# Patient Record
Sex: Female | Born: 1970 | Race: Black or African American | Hispanic: No | Marital: Single | Smoking: Never smoker
Health system: Southern US, Community
[De-identification: ages and names within clinical notes are randomized; demographics above are authoritative.]

## PROBLEM LIST (undated history)

## (undated) DIAGNOSIS — E785 Hyperlipidemia, unspecified: Secondary | ICD-10-CM

## (undated) DIAGNOSIS — I10 Essential (primary) hypertension: Secondary | ICD-10-CM

## (undated) HISTORY — PX: TOTAL ABDOMINAL HYSTERECTOMY: SHX209

---

## 2004-06-24 ENCOUNTER — Emergency Department: Payer: Self-pay | Admitting: General Practice

## 2004-07-14 ENCOUNTER — Inpatient Hospital Stay: Payer: Self-pay | Admitting: Obstetrics & Gynecology

## 2005-11-06 ENCOUNTER — Emergency Department: Payer: Self-pay | Admitting: Emergency Medicine

## 2005-11-29 ENCOUNTER — Emergency Department: Payer: Self-pay | Admitting: Emergency Medicine

## 2006-06-13 ENCOUNTER — Emergency Department: Payer: Self-pay | Admitting: Emergency Medicine

## 2006-08-21 ENCOUNTER — Emergency Department: Payer: Self-pay | Admitting: Emergency Medicine

## 2006-08-21 ENCOUNTER — Other Ambulatory Visit: Payer: Self-pay

## 2006-11-09 ENCOUNTER — Ambulatory Visit: Payer: Self-pay | Admitting: Orthopedic Surgery

## 2006-11-16 ENCOUNTER — Ambulatory Visit: Payer: Self-pay | Admitting: Orthopedic Surgery

## 2006-11-30 ENCOUNTER — Ambulatory Visit: Payer: Self-pay | Admitting: Orthopedic Surgery

## 2007-07-23 ENCOUNTER — Emergency Department: Payer: Self-pay | Admitting: Emergency Medicine

## 2009-01-04 ENCOUNTER — Ambulatory Visit: Payer: Self-pay | Admitting: Family Medicine

## 2009-01-31 ENCOUNTER — Ambulatory Visit: Payer: Self-pay | Admitting: Family Medicine

## 2009-11-17 ENCOUNTER — Emergency Department: Payer: Self-pay | Admitting: Internal Medicine

## 2010-07-12 ENCOUNTER — Emergency Department: Payer: Self-pay | Admitting: Unknown Physician Specialty

## 2010-08-13 ENCOUNTER — Emergency Department: Payer: Self-pay | Admitting: Unknown Physician Specialty

## 2010-12-21 ENCOUNTER — Emergency Department: Payer: Self-pay | Admitting: Emergency Medicine

## 2011-03-21 ENCOUNTER — Emergency Department: Payer: Self-pay | Admitting: Emergency Medicine

## 2011-03-31 ENCOUNTER — Ambulatory Visit: Payer: Self-pay | Admitting: Emergency Medicine

## 2011-12-01 ENCOUNTER — Emergency Department: Payer: Self-pay | Admitting: Emergency Medicine

## 2012-03-01 ENCOUNTER — Emergency Department: Payer: Self-pay | Admitting: Emergency Medicine

## 2012-03-17 ENCOUNTER — Ambulatory Visit: Payer: Self-pay | Admitting: Family Medicine

## 2012-07-24 ENCOUNTER — Emergency Department: Payer: Self-pay | Admitting: Emergency Medicine

## 2012-11-22 ENCOUNTER — Emergency Department: Payer: Self-pay | Admitting: Emergency Medicine

## 2012-11-22 LAB — CBC
HCT: 36.3 % (ref 35.0–47.0)
HGB: 12.5 g/dL (ref 12.0–16.0)
MCH: 28.7 pg (ref 26.0–34.0)
RBC: 4.35 10*6/uL (ref 3.80–5.20)
RDW: 13.6 % (ref 11.5–14.5)
WBC: 5.7 10*3/uL (ref 3.6–11.0)

## 2012-11-22 LAB — BASIC METABOLIC PANEL
Anion Gap: 5 — ABNORMAL LOW (ref 7–16)
Calcium, Total: 8.5 mg/dL (ref 8.5–10.1)
Co2: 28 mmol/L (ref 21–32)
Creatinine: 0.7 mg/dL (ref 0.60–1.30)
EGFR (Non-African Amer.): >60

## 2012-11-22 LAB — TROPONIN I: Troponin-I: <0.02 ng/mL

## 2012-12-29 ENCOUNTER — Emergency Department: Payer: Self-pay | Admitting: Emergency Medicine

## 2013-03-10 ENCOUNTER — Emergency Department: Payer: Self-pay | Admitting: Emergency Medicine

## 2013-03-21 ENCOUNTER — Emergency Department: Payer: Self-pay | Admitting: Emergency Medicine

## 2013-03-21 LAB — BASIC METABOLIC PANEL WITH GFR
Anion Gap: 3 — ABNORMAL LOW
BUN: 9 mg/dL
Calcium, Total: 9.2 mg/dL
Chloride: 105 mmol/L
Co2: 30 mmol/L
Creatinine: 0.8 mg/dL
EGFR (African American): >60
EGFR (Non-African Amer.): >60
Glucose: 112 mg/dL — ABNORMAL HIGH
Osmolality: 275
Potassium: 3.6 mmol/L
Sodium: 138 mmol/L

## 2013-03-21 LAB — CBC
HCT: 38.8 %
HGB: 13.8 g/dL
MCH: 29.2 pg
MCHC: 35.6 g/dL
MCV: 82 fL
Platelet: 193 x10 3/mm 3
RBC: 4.74 X10 6/mm 3
RDW: 13 %
WBC: 3.9 x10 3/mm 3

## 2013-03-21 LAB — PROTIME-INR
INR: 1.1
Prothrombin Time: 14.2 s

## 2013-04-27 ENCOUNTER — Emergency Department: Payer: Self-pay | Admitting: Emergency Medicine

## 2013-04-27 LAB — COMPREHENSIVE METABOLIC PANEL
Albumin: 4 g/dL (ref 3.4–5.0)
Alkaline Phosphatase: 81 U/L (ref 50–136)
Bilirubin,Total: 0.7 mg/dL (ref 0.2–1.0)
Co2: 29 mmol/L (ref 21–32)
Potassium: 3.5 mmol/L (ref 3.5–5.1)
SGOT(AST): 23 U/L (ref 15–37)
SGPT (ALT): 21 U/L (ref 12–78)
Sodium: 136 mmol/L (ref 136–145)
Total Protein: 8 g/dL (ref 6.4–8.2)

## 2013-04-27 LAB — CBC
HCT: 39.9 % (ref 35.0–47.0)
MCHC: 34.7 g/dL (ref 32.0–36.0)
Platelet: 202 10*3/uL (ref 150–440)
RDW: 12.8 % (ref 11.5–14.5)
WBC: 4.9 10*3/uL (ref 3.6–11.0)

## 2013-04-27 LAB — PRO B NATRIURETIC PEPTIDE: B-Type Natriuretic Peptide: 29 pg/mL (ref 0–125)

## 2013-04-27 LAB — TROPONIN I: Troponin-I: <0.02 ng/mL

## 2013-04-27 LAB — CK TOTAL AND CKMB (NOT AT ARMC): CK-MB: 0.9 ng/mL (ref 0.5–3.6)

## 2013-06-14 ENCOUNTER — Emergency Department: Payer: Self-pay | Admitting: Emergency Medicine

## 2013-07-17 ENCOUNTER — Emergency Department: Payer: Self-pay | Admitting: Emergency Medicine

## 2014-04-03 ENCOUNTER — Emergency Department: Payer: Self-pay | Admitting: Emergency Medicine

## 2014-04-03 LAB — BASIC METABOLIC PANEL
Anion Gap: 6 — ABNORMAL LOW (ref 7–16)
BUN: 11 mg/dL (ref 7–18)
Calcium, Total: 8.8 mg/dL (ref 8.5–10.1)
Chloride: 105 mmol/L (ref 98–107)
Co2: 27 mmol/L (ref 21–32)
Creatinine: 0.98 mg/dL (ref 0.60–1.30)
EGFR (Non-African Amer.): >60
Glucose: 127 mg/dL — ABNORMAL HIGH (ref 65–99)
Osmolality: 277 (ref 275–301)
Potassium: 3.5 mmol/L (ref 3.5–5.1)
Sodium: 138 mmol/L (ref 136–145)

## 2014-04-03 LAB — TROPONIN I
Troponin-I: <0.02 ng/mL
Troponin-I: <0.02 ng/mL

## 2014-04-03 LAB — CBC
HCT: 38.4 % (ref 35.0–47.0)
HGB: 13 g/dL (ref 12.0–16.0)
MCH: 28.4 pg (ref 26.0–34.0)
MCHC: 33.9 g/dL (ref 32.0–36.0)
MCV: 84 fL (ref 80–100)
PLATELETS: 194 10*3/uL (ref 150–440)
RBC: 4.6 10*6/uL (ref 3.80–5.20)
RDW: 13.4 % (ref 11.5–14.5)
WBC: 5.4 10*3/uL (ref 3.6–11.0)

## 2014-04-03 LAB — PRO B NATRIURETIC PEPTIDE: B-TYPE NATIURETIC PEPTID: 49 pg/mL (ref 0–125)

## 2014-09-17 ENCOUNTER — Ambulatory Visit: Payer: Self-pay | Admitting: Internal Medicine

## 2014-10-07 LAB — TSH: TSH: 1.99 u[IU]/mL (ref <=5.90)

## 2014-10-17 LAB — LIPID PANEL
Cholesterol: 221 mg/dL — AB (ref 0–200)
HDL: 33 mg/dL — AB (ref 35–70)
LDL Cholesterol: 149 mg/dL
Triglycerides: 193 mg/dL — AB (ref 40–160)

## 2014-10-17 LAB — BASIC METABOLIC PANEL
BUN: 11 mg/dL (ref 4–21)
Creatinine: 0.9 mg/dL (ref <=1.1)

## 2014-10-17 LAB — CBC AND DIFFERENTIAL: HEMOGLOBIN: 13.9 g/dL (ref 12.0–16.0)

## 2014-10-17 LAB — HEMOGLOBIN A1C: Hgb A1c MFr Bld: 5.9 % (ref 4.0–6.0)

## 2014-11-07 ENCOUNTER — Ambulatory Visit
Admit: 2014-11-07 | Disposition: A | Discharge status: Home / Self Care | Payer: Self-pay | Attending: Family Medicine | Admitting: Family Medicine

## 2015-06-05 ENCOUNTER — Encounter: Payer: Self-pay | Admitting: Internal Medicine

## 2015-06-05 DIAGNOSIS — R9389 Abnormal findings on diagnostic imaging of other specified body structures: Secondary | ICD-10-CM | POA: Insufficient documentation

## 2015-06-05 DIAGNOSIS — R739 Hyperglycemia, unspecified: Secondary | ICD-10-CM | POA: Insufficient documentation

## 2015-06-05 DIAGNOSIS — R0789 Other chest pain: Secondary | ICD-10-CM | POA: Insufficient documentation

## 2015-06-05 DIAGNOSIS — R600 Localized edema: Secondary | ICD-10-CM | POA: Insufficient documentation

## 2015-06-05 DIAGNOSIS — R252 Cramp and spasm: Secondary | ICD-10-CM | POA: Insufficient documentation

## 2015-06-05 DIAGNOSIS — G44219 Episodic tension-type headache, not intractable: Secondary | ICD-10-CM | POA: Insufficient documentation

## 2016-01-09 ENCOUNTER — Emergency Department
Admission: EM | Admit: 2016-01-09 | Discharge: 2016-01-09 | Disposition: A | Discharge status: Home / Self Care | Payer: Self-pay | Attending: Emergency Medicine | Admitting: Emergency Medicine

## 2016-01-09 DIAGNOSIS — S29012A Strain of muscle and tendon of back wall of thorax, initial encounter: Secondary | ICD-10-CM | POA: Insufficient documentation

## 2016-01-09 DIAGNOSIS — Y999 Unspecified external cause status: Secondary | ICD-10-CM | POA: Insufficient documentation

## 2016-01-09 DIAGNOSIS — Y929 Unspecified place or not applicable: Secondary | ICD-10-CM | POA: Insufficient documentation

## 2016-01-09 DIAGNOSIS — Y9389 Activity, other specified: Secondary | ICD-10-CM | POA: Insufficient documentation

## 2016-01-09 DIAGNOSIS — X503XXA Overexertion from repetitive movements, initial encounter: Secondary | ICD-10-CM | POA: Insufficient documentation

## 2016-01-09 MED ORDER — MELOXICAM 15 MG PO TABS: 15.0000 mg | ORAL_TABLET | Freq: Every day | ORAL | Status: DC

## 2016-01-09 MED ORDER — KETOROLAC TROMETHAMINE 60 MG/2ML IM SOLN
60.0000 mg | Freq: Once | INTRAMUSCULAR | Status: AC
  Administered 2016-01-09: 60 mg via INTRAMUSCULAR
  Filled 2016-01-09: qty 2

## 2016-01-09 MED ORDER — METHOCARBAMOL 500 MG PO TABS: 500.0000 mg | ORAL_TABLET | Freq: Four times a day (QID) | ORAL | Status: DC

## 2016-01-09 MED ORDER — DIAZEPAM 5 MG/ML IJ SOLN
5.0000 mg | Freq: Once | INTRAMUSCULAR | Status: AC
  Administered 2016-01-09: 5 mg via INTRAMUSCULAR
  Filled 2016-01-09: qty 2

## 2016-01-09 NOTE — ED Notes (Signed)
In with co back pain states from upper back to lower back , does a lot of moving and lifting at work. States worse when she moves or is lifting.

## 2016-01-09 NOTE — ED Notes (Signed)
Upon assessment Pt. Reports upper back pain 9/10. 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 Upon assessment, pt. Laying across the bed playing with granddaughter in the room and eating m[employer]ds. Pt. Reports home tx of iburpofen 3 days ago that wore off and denies tx since. Pt. Denies positional changes to relieve pain.

## 2016-01-09 NOTE — ED Provider Notes (Signed)
[facility] Emergency Department Provider Note  ____________________________________________  Time seen: Approximately 9:51 PM  I have reviewed the triage vital signs and the nursing notes.   HISTORY  Chief Complaint Back Pain    HPI Ashley Stephenson is a 45 y.o. female presents to the ER complaining of right sided mid back pain. 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 The patient denies any injury to area. She states that its both a burning and tight sensation. No numbness and tingling in extremities. No HA, neck pain, fever/chills, abd pain, N/V. Patient has not tried any medications prior to arrival.   No past medical history on file.  Patient Active Problem List   Diagnosis Date Noted  . Abnormal chest x-ray 06/05/2015  . Atypical chest pain 06/05/2015  . Edema extremities 06/05/2015  . Episodic tension type headache 06/05/2015  . Blood glucose elevated 06/05/2015  . Muscle cramp, nocturnal 06/05/2015    Past Surgical History  Procedure Laterality Date  . Total abdominal hysterectomy      bleeding    Current Outpatient Rx  Name  Route  Sig  Dispense  Refill  . meloxicam (MOBIC) 15 MG tablet   Oral   Take 1 tablet (15 mg total) by mouth daily.   30 tablet   0   . methocarbamol (ROBAXIN) 500 MG tablet   Oral   Take 1 tablet (500 mg total) by mouth 4 (four) times daily.   16 tablet   0     Allergies Penicillins  Family History  Problem Relation Age of Onset  . CAD Mother     Social History Social History  Substance Use Topics  . Smoking status: Never Smoker   . Smokeless tobacco: Not on file  . Alcohol Use: No     Review of Systems  Constitutional: No fever/chills Cardiovascular: no chest pain. Respiratory: no cough. No SOB. Gastrointestinal: No abdominal pain.  No nausea, no vomiting.   Musculoskeletal: Positive for mid back pain on R Skin: Negative for rash, abrasions,  lacerations, ecchymosis. Neurological: Negative for headaches, focal weakness or numbness. 10-point ROS otherwise negative.  ____________________________________________   PHYSICAL EXAM:  VITAL SIGNS: ED Triage Vitals  Enc Vitals Group     BP 01/09/16 2020 144/70 mmHg     Pulse Rate 01/09/16 2020 84     Resp 01/09/16 2020 18     Temp 01/09/16 2020 98.2 F (36.8 C)     Temp Source 01/09/16 2020 Oral     SpO2 01/09/16 2020 98 %     Weight 01/09/16 2020 270 lb (122.471 kg)     Height 01/09/16 2020  (1.575 m)     Head Cir --      Peak Flow --      Pain Score 01/09/16 2020 9     Pain Loc --      Pain Edu? --      Excl. in GC? --      Constitutional: Alert and oriented. Well appearing and in no acute distress. Eyes: Conjunctivae are normal. PERRL. EOMI. Head: Atraumatic. Neck: No stridor.  No cervical spine tenderness to palpation.  Cardiovascular: Normal rate, regular rhythm. Normal S1 and S2.  Good peripheral circulation. Respiratory: Normal respiratory effort without tachypnea or retractions. Lungs CTAB. Good air entry to the bases with no decreased or absent breath sounds.  Musculoskeletal: Full range of motion to all extremities. No gross deformities appreciated. No deformity noted  to spine upon inspection. FROM of back. Non-tender to palpation midline spinal processes. Tenderness diffusely to paraspinal muscle group in right side thoracic region. Spasms are palpated. Pulses and sensation intact x 4 extremities.  Neurologic:  Normal speech and language. No gross focal neurologic deficits are appreciated.  Skin:  Skin is warm, dry and intact. No rash noted. Psychiatric: Mood and affect are normal. Speech and behavior are normal. Patient exhibits appropriate insight and judgement.   ____________________________________________   LABS (all labs ordered are listed, but only abnormal results are displayed)  Labs Reviewed - No data to  display ____________________________________________  EKG   ____________________________________________  RADIOLOGY   No results found.  ____________________________________________    PROCEDURES  Procedure(s) performed:       Medications  ketorolac (TORADOL) injection 60 mg (60 mg Intramuscular Given 01/09/16 2206)  diazepam (VALIUM) injection 5 mg (5 mg Intramuscular Given 01/09/16 2206)     ____________________________________________   INITIAL IMPRESSION / ASSESSMENT AND PLAN / ED COURSE  Pertinent labs & imaging results that were available during my care of the patient were reviewed by me and considered in my medical decision making (see chart for details).  Patient's diagnosis is consistent with muscle strain to thoracic region. Patient is given toradol and valium IM injections in the ED. Patient will be discharged home with prescriptions for meloxicam and robaxin. Patient is to follow up with primary care provider as needed or otherwise directed. Patient is given ED precautions to return to the ED for any worsening or new symptoms.     ____________________________________________  FINAL CLINICAL IMPRESSION(S) / ED DIAGNOSES  Final diagnoses:  Strain of thoracic paraspinal muscles excluding T1 and T2 levels, initial encounter      NEW MEDICATIONS STARTED DURING THIS VISIT:  Discharge Medication List as of 01/09/2016  9:53 PM    START taking these medications   Details  meloxicam (MOBIC) 15 MG tablet Take 1 tablet (15 mg total) by mouth daily., Starting 01/09/2016, Until Discontinued, Print    methocarbamol (ROBAXIN) 500 MG tablet Take 1 tablet (500 mg total) by mouth 4 (four) times daily., Starting 01/09/2016, Until Discontinued, Print            This chart was dictated using voice recognition software/Dragon. Despite best efforts to proofread, errors can occur which can change the meaning. Any change was purely unintentional.    Racheal PatchesJonathan D  Keelee Yankey, PA-C 01/10/16 0245  Loleta Roseory Forbach, MD 01/13/16 1528

## 2016-01-09 NOTE — Discharge Instructions (Signed)

## 2016-07-04 ENCOUNTER — Emergency Department: Payer: Self-pay

## 2016-07-04 ENCOUNTER — Encounter: Payer: Self-pay | Admitting: Emergency Medicine

## 2016-07-04 ENCOUNTER — Emergency Department
Admission: EM | Admit: 2016-07-04 | Discharge: 2016-07-04 | Disposition: A | Discharge status: Home / Self Care | Payer: Self-pay | Attending: Student in an Organized Health Care Education/Training Program | Admitting: Student in an Organized Health Care Education/Training Program

## 2016-07-04 DIAGNOSIS — J40 Bronchitis, not specified as acute or chronic: Secondary | ICD-10-CM | POA: Insufficient documentation

## 2016-07-04 DIAGNOSIS — Z79899 Other long term (current) drug therapy: Secondary | ICD-10-CM | POA: Insufficient documentation

## 2016-07-04 MED ORDER — PREDNISONE 10 MG PO TABS: 40.0000 mg | ORAL_TABLET | Freq: Every day | ORAL | 0 refills | Status: AC

## 2016-07-04 NOTE — ED Triage Notes (Signed)
Patient reports coughing up phlegm for approx one month. Denies other URI symptoms. Denies any pain. Patient states phlegm is clear in nature. Patient in no acute distress. Ambulatory to triage without difficulty, speaking in complete sentences without difficulty.

## 2016-07-04 NOTE — ED Notes (Signed)

## 2016-07-04 NOTE — ED Provider Notes (Signed)
[facility] Emergency Department Provider Note  ____________________________________________  Time seen: Approximately 3:38 PM  I have reviewed the triage vital signs and the nursing notes.   HISTORY  Chief Complaint Cough  HPI Ashley Stephenson is a 45 y.o. female who presents with 1 month of productive cough. Patient has been coughing up clear sputum. Patient was sick with a cold 2 months ago. Patient denies nasal congestion. Patient denies fever. Patient has not taken anything for symptoms. Patient does not smoke.   History reviewed. No pertinent past medical history.  Patient Active Problem List   Diagnosis Date Noted  . Abnormal chest x-ray 06/05/2015  . Atypical chest pain 06/05/2015  . Edema extremities 06/05/2015  . Episodic tension type headache 06/05/2015  . Blood glucose elevated 06/05/2015  . Muscle cramp, nocturnal 06/05/2015    Past Surgical History:  Procedure Laterality Date  . TOTAL ABDOMINAL HYSTERECTOMY     bleeding    Prior to Admission medications   Medication Sig Start Date End Date Taking? Authorizing Provider  meloxicam (MOBIC) 15 MG tablet Take 1 tablet (15 mg total) by mouth daily. 01/09/16   Delorise RoyalsJonathan D Cuthriell, PA-C  methocarbamol (ROBAXIN) 500 MG tablet Take 1 tablet (500 mg total) by mouth 4 (four) times daily. 01/09/16   Delorise RoyalsJonathan D Cuthriell, PA-C  predniSONE (DELTASONE) 10 MG tablet Take 4 tablets (40 mg total) by mouth daily. 07/04/16 07/09/16  Enid DerryAshley Verline Kong, PA-C    Allergies Penicillins  Family History  Problem Relation Age of Onset  . CAD Mother     Social History Social History  Substance Use Topics  . Smoking status: Never Smoker  . Smokeless tobacco: Never Used  . Alcohol use No    Review of Systems Constitutional: Negative fever/chills.  ENT: Negative sore throat. No nasal congestion.  Cardiovascular: Denies chest pain. Respiratory: Negative shortness of breath. Positive for  cough. Gastrointestinal: Negative nausea. Negative vomiting.  Negative diarrhea.  Musculoskeletal: Negative for body aches. Skin: Negative for rash.  ____________________________________________   PHYSICAL EXAM:  VITAL SIGNS: ED Triage Vitals  Enc Vitals Group     BP 07/04/16 1444 (!) 153/64     Pulse Rate 07/04/16 1444 90     Resp 07/04/16 1444 18     Temp 07/04/16 1444 97.9 F (36.6 C)     Temp Source 07/04/16 1444 Oral     SpO2 07/04/16 1444 98 %     Weight 07/04/16 1445 276 lb 11.2 oz (125.5 kg)     Height 07/04/16 1445 5\' 5"  (1.651 m)     Head Circumference --      Peak Flow --      Pain Score --      Pain Loc --      Pain Edu? --      Excl. in GC? --     Constitutional: Alert and oriented. Well appearing and in no acute distress. Eyes: Conjunctivae are normal. EOMI. Nose: No congestion; no rhinnorhea. Mouth/Throat: Mucous membranes are moist.  Oropharynx non erythematous. Tonsils appear non enlarged. Neck: No stridor.  Cardiovascular: Normal rate, regular rhythm. Grossly normal heart sounds.  Good peripheral circulation. Respiratory: Normal respiratory effort.  No retractions. Lungs CTAB. Gastrointestinal: Soft and nontender.  Musculoskeletal: FROM x 4 extremities.  Neurologic:  Normal speech and language.  Skin:  Skin is warm, dry and intact. No rash noted. Psychiatric: Mood and affect are normal. Speech and behavior are normal.  ____________________________________________   LABS (all labs ordered are  listed, but only abnormal results are displayed)  Labs Reviewed - No data to display ____________________________________________  EKG   RADIOLOGY I, Enid DerryAshley Tommy Goostree, personally viewed and evaluated these images (plain radiographs) as part of my medical decision making, as well as reviewing the written report by the radiologist.   Xray findings per radiology FINDINGS:  Lung volumes are normal. No consolidative airspace disease. No  pleural effusions. No  pneumothorax. No pulmonary nodule or mass  noted. Pulmonary vasculature and the cardiomediastinal silhouette  are within normal limits.    IMPRESSION:  No radiographic evidence of acute cardiopulmonary disease.      PROCEDURES  Critical Care performed: No  ____________________________________________   INITIAL IMPRESSION / ASSESSMENT AND PLAN / ED COURSE  Clinical Course     Pertinent labs & imaging results that were available during my care of the patient were reviewed by me and considered in my medical decision making (see chart for details).  Patient likely has viral bronchitis that has persisted after an upper respiratory infection 2 months ago. She was given a course of prednisone. Patient was instructed to follow-up with family doctor and return to the ED if symptoms persist or worsen. No evidence of PE, pneumonia, CHF, pneumothorax, pulmonary edema, Mi, or airway obstruction.   ____________________________________________   FINAL CLINICAL IMPRESSION(S) / ED DIAGNOSES  Final diagnoses:  Bronchitis    Note:  This document was prepared using Dragon voice recognition software and may include unintentional dictation errors.    Enid DerryAshley Janson Lamar, PA-C 07/04/16 1820    Willy EddyPatrick Robinson, MD 07/05/16 [phone]

## 2016-09-11 ENCOUNTER — Encounter: Payer: Self-pay | Admitting: Emergency Medicine

## 2016-09-11 ENCOUNTER — Emergency Department
Admission: EM | Admit: 2016-09-11 | Discharge: 2016-09-11 | Disposition: A | Discharge status: Home / Self Care | Payer: Self-pay | Attending: Emergency Medicine | Admitting: Emergency Medicine

## 2016-09-11 DIAGNOSIS — J069 Acute upper respiratory infection, unspecified: Secondary | ICD-10-CM | POA: Insufficient documentation

## 2016-09-11 NOTE — ED Provider Notes (Signed)
[facility] Emergency Department Provider Note  ____________________________________________   First MD Initiated Contact with Patient 09/11/16 [phone]     (approximate)  I have reviewed the triage vital signs and the nursing notes.   HISTORY  Chief Complaint Torticollis and sore throat   HPI Ashley Stephenson is a 46 y.o. female is here complaining of sore throat, left earache, cough and congestion for 4 days.Patient is unaware of any fever or chills. She denies any nausea or vomiting. Patient has not taken any over-the-counter medication for her discomfort. She also states that the left side of her neck is uncomfortable.   History reviewed. No pertinent past medical history.  Patient Active Problem List   Diagnosis Date Noted  . Abnormal chest x-ray 06/05/2015  . Atypical chest pain 06/05/2015  . Edema extremities 06/05/2015  . Episodic tension type headache 06/05/2015  . Blood glucose elevated 06/05/2015  . Muscle cramp, nocturnal 06/05/2015    Past Surgical History:  Procedure Laterality Date  . TOTAL ABDOMINAL HYSTERECTOMY     bleeding    Prior to Admission medications   Not on File    Allergies Penicillins  Family History  Problem Relation Age of Onset  . CAD Mother     Social History Social History  Substance Use Topics  . Smoking status: Never Smoker  . Smokeless tobacco: Never Used  . Alcohol use No    Review of Systems Constitutional: No fever/chills Eyes: No visual changes. ENT: Positive sore throat. Positive left earache. Cardiovascular: Denies chest pain. Respiratory: Denies shortness of breath. Positive cough. Gastrointestinal: No abdominal pain.  No nausea, no vomiting.  No diarrhea.   Musculoskeletal: Positive neck pain. Skin: Negative for rash. Neurological: Negative for headaches, focal weakness or numbness.  10-point ROS otherwise negative.  ____________________________________________   PHYSICAL  EXAM:  VITAL SIGNS: ED Triage Vitals  Enc Vitals Group     BP 09/11/16 0910 (!) 159/80     Pulse Rate 09/11/16 0910 86     Resp 09/11/16 0910 15     Temp 09/11/16 0910 98.2 F (36.8 C)     Temp Source 09/11/16 0910 Oral     SpO2 09/11/16 0910 96 %     Weight 09/11/16 0912 274 lb (124.3 kg)     Height 09/11/16 0910 5\' 2"  (1.575 m)     Head Circumference --      Peak Flow --      Pain Score 09/11/16 0913 8     Pain Loc --      Pain Edu? --      Excl. in GC? --     Constitutional: Alert and oriented. Well appearing and in no acute distress. Eyes: Conjunctivae are normal. PERRL. EOMI. Head: Atraumatic. Nose: Mild congestion/rhinnorhea. Left TM is dull with mild effusion present. No erythema or injection was seen. There is some tenderness on palpation of the left lateral neck without cervical adenopathy. Mouth/Throat: Mucous membranes are moist.  Oropharynx non-erythematous. Neck: No stridor.   Hematological/Lymphatic/Immunilogical: No cervical lymphadenopathy. Cardiovascular: Normal rate, regular rhythm. Grossly normal heart sounds.  Good peripheral circulation. Respiratory: Normal respiratory effort.  No retractions. Lungs CTAB. Musculoskeletal: No lower extremity tenderness nor edema.  No joint effusions. Neurologic:  Normal speech and language. No gross focal neurologic deficits are appreciated. No gait instability. Skin:  Skin is warm, dry and intact. No rash noted. Psychiatric: Mood and affect are normal. Speech and behavior are normal.  ____________________________________________   LABS (all labs  ordered are listed, but only abnormal results are displayed)  Labs Reviewed - No data to display   PROCEDURES  Procedure(s) performed: None  Procedures  Critical Care performed: No  ____________________________________________   INITIAL IMPRESSION / ASSESSMENT AND PLAN / ED COURSE  Pertinent labs & imaging results that were available during my care of the patient  were reviewed by me and considered in my medical decision making (see chart for details).   Patient is to increase fluids and take Tylenol or ibuprofen as needed for pain or fever. She is to obtain Zyrtec-D or Claritin-D as needed for congestion which should take care of her left ear pain. She is also instructed to use saline nose spray if needed for nasal congestion. She is to [facility] if any continued problems in 3-4 days.     ____________________________________________   FINAL CLINICAL IMPRESSION(S) / ED DIAGNOSES  Final diagnoses:  Acute upper respiratory infection      NEW MEDICATIONS STARTED DURING THIS VISIT:  Discharge Medication List as of 09/11/2016 10:10 AM       Note:  This document was prepared using Dragon voice recognition software and may include unintentional dictation errors.    Tommi RumpsRhonda L Summers, PA-C 09/11/16 1513    Myrna Blazeravid Matthew Schaevitz, MD 09/11/16 [phone]

## 2016-09-11 NOTE — ED Notes (Signed)
See triage note. States she developed sore throat about 4 days ago  Also has had intermittent cramping to left side of neck   And now having pain to left ear   No fever but has had cough

## 2016-09-11 NOTE — ED Triage Notes (Signed)
Sore throat and neck pain, and cough x 4 days.  Denies fever.

## 2016-09-11 NOTE — Discharge Instructions (Signed)
Follow up with [facility] if any continued problems in 3 to 4 days . Increase fluids, tylenol or ibuprofen for pain or fever. Zyrtec D or Claritin D as needed for congestion.You may also use saline nose spray for nasal congestion.

## 2016-10-13 ENCOUNTER — Emergency Department: Payer: Self-pay

## 2016-10-13 ENCOUNTER — Encounter: Payer: Self-pay | Admitting: Emergency Medicine

## 2016-10-13 ENCOUNTER — Emergency Department
Admission: EM | Admit: 2016-10-13 | Discharge: 2016-10-13 | Disposition: A | Discharge status: Home / Self Care | Payer: Self-pay | Attending: Emergency Medicine | Admitting: Emergency Medicine

## 2016-10-13 DIAGNOSIS — J40 Bronchitis, not specified as acute or chronic: Secondary | ICD-10-CM | POA: Insufficient documentation

## 2016-10-13 MED ORDER — IPRATROPIUM-ALBUTEROL 0.5-2.5 (3) MG/3ML IN SOLN
3.0000 mL | Freq: Once | RESPIRATORY_TRACT | Status: AC
  Administered 2016-10-13: 3 mL via RESPIRATORY_TRACT
  Filled 2016-10-13: qty 3

## 2016-10-13 MED ORDER — HYDROCOD POLST-CPM POLST ER 10-8 MG/5ML PO SUER: 5.0000 mL | Freq: Two times a day (BID) | ORAL | 0 refills | Status: DC

## 2016-10-13 MED ORDER — PREDNISONE 20 MG PO TABS: 60.0000 mg | ORAL_TABLET | Freq: Every day | ORAL | 0 refills | Status: DC

## 2016-10-13 MED ORDER — BENZONATATE 100 MG PO CAPS
100.0000 mg | ORAL_CAPSULE | Freq: Once | ORAL | Status: AC
  Administered 2016-10-13: 100 mg via ORAL
  Filled 2016-10-13: qty 1

## 2016-10-13 MED ORDER — ALBUTEROL SULFATE HFA 108 (90 BASE) MCG/ACT IN AERS: 2.0000 | INHALATION_SPRAY | Freq: Four times a day (QID) | RESPIRATORY_TRACT | 0 refills | Status: DC | PRN

## 2016-10-13 MED ORDER — IBUPROFEN 600 MG PO TABS
600.0000 mg | ORAL_TABLET | Freq: Once | ORAL | Status: AC
  Administered 2016-10-13: 600 mg via ORAL
  Filled 2016-10-13: qty 1

## 2016-10-13 MED ORDER — AZITHROMYCIN 250 MG PO TABS
ORAL_TABLET | ORAL | 0 refills | Status: AC
Start: 2016-10-13 — End: 2016-10-18

## 2016-10-13 NOTE — ED Triage Notes (Signed)
Pt in with cough and congestion for 4-5 days, denies a fever.

## 2016-10-13 NOTE — ED Notes (Signed)
Patient transported to X-ray 

## 2016-10-13 NOTE — ED Provider Notes (Signed)
[facility] Emergency Department Provider Note   ____________________________________________   First MD Initiated Contact with Patient 10/13/16 [phone]     (approximate)  I have reviewed the triage vital signs and the nursing notes.   HISTORY  Chief Complaint Cough    HPI Ashley Stephenson is a 46 y.o. female who comes into the hospital today with some cough. The patient reports that her chest hurts when she coughs. She's had the symptoms reported 5 days. She's been taking DayQuil and NyQuil. Her cough has been productive of phlegm that is white appearance. The patient has occasional shortness of breath but denies any fevers, chills, nausea. She has had some posttussive emesis. The patient does not have a primary care physician. She denies any abdominal pain and is unsure she's had no sick contacts but she works at [employer] where she's been around a lot of people. The patient decided to come into the hospital today for further evaluation of her symptoms.She rates her pain 8 out of 10 in intensity.   History reviewed. No pertinent past medical history.  Patient Active Problem List   Diagnosis Date Noted  . Abnormal chest x-ray 06/05/2015  . Atypical chest pain 06/05/2015  . Edema extremities 06/05/2015  . Episodic tension type headache 06/05/2015  . Blood glucose elevated 06/05/2015  . Muscle cramp, nocturnal 06/05/2015    Past Surgical History:  Procedure Laterality Date  . TOTAL ABDOMINAL HYSTERECTOMY     bleeding    Prior to Admission medications   Medication Sig Start Date End Date Taking? Authorizing Provider  albuterol (PROVENTIL HFA;VENTOLIN HFA) 108 (90 Base) MCG/ACT inhaler Inhale 2 puffs into the lungs every 6 (six) hours as needed for wheezing or shortness of breath. 10/13/16   Rebecka Apley, MD  azithromycin (ZITHROMAX Z-PAK) 250 MG tablet Take 2 tablets (500 mg) on  Day 1,  followed by 1 tablet (250 mg) once daily on Days 2 through  5. 10/13/16 10/18/16  Rebecka Apley, MD  chlorpheniramine-HYDROcodone [facility] PENNKINETIC ER) 10-8 MG/5ML SUER Take 5 mLs by mouth 2 (two) times daily. 10/13/16   Rebecka Apley, MD  predniSONE (DELTASONE) 20 MG tablet Take 3 tablets (60 mg total) by mouth daily. 10/13/16   Rebecka Apley, MD    Allergies Penicillins  Family History  Problem Relation Age of Onset  . CAD Mother     Social History Social History  Substance Use Topics  . Smoking status: Never Smoker  . Smokeless tobacco: Never Used  . Alcohol use No    Review of Systems Constitutional: No fever/chills Eyes: No visual changes. ENT: No sore throat. Cardiovascular: cough with chest pain. Respiratory: cough and shortness of breath. Gastrointestinal: No abdominal pain.  No nausea, no vomiting.  No diarrhea.  No constipation. Genitourinary: Negative for dysuria. Musculoskeletal: Negative for back pain. Skin: Negative for rash. Neurological: Negative for headaches, focal weakness or numbness.  10-point ROS otherwise negative.  ____________________________________________   PHYSICAL EXAM:  VITAL SIGNS: ED Triage Vitals [10/13/16 0619]  Enc Vitals Group     BP 140/60     Pulse Rate 80     Resp 18     Temp 98.5 F (36.9 C)     Temp Source Oral     SpO2 95 %     Weight 260 lb (117.9 kg)     Height 5\' 2"  (1.575 m)     Head Circumference      Peak Flow  Pain Score 8     Pain Loc      Pain Edu?      Excl. in GC?     Constitutional: Alert and oriented. Well appearing and in mild distress. Eyes: Conjunctivae are normal. PERRL. EOMI. Head: Atraumatic. Nose: No congestion/rhinnorhea. Mouth/Throat: Mucous membranes are moist.  Oropharynx non-erythematous. Cardiovascular: Normal rate, regular rhythm. Grossly normal heart sounds.  Good peripheral circulation. Respiratory: Normal respiratory effort.  No retractions. Diminished breath sounds throughout. Gastrointestinal: Soft and nontender. No  distention. Positive bowel sounds Musculoskeletal: No lower extremity tenderness nor edema.   Neurologic:  Normal speech and language.  Skin:  Skin is warm, dry and intact. Psychiatric: Mood and affect are normal.  ____________________________________________   LABS (all labs ordered are listed, but only abnormal results are displayed)  Labs Reviewed - No data to display ____________________________________________  EKG  none ____________________________________________  RADIOLOGY  CXR ____________________________________________   PROCEDURES  Procedure(s) performed: None  Procedures  Critical Care performed: No  ____________________________________________   INITIAL IMPRESSION / ASSESSMENT AND PLAN / ED COURSE  Pertinent labs & imaging results that were available during my care of the patient were reviewed by me and considered in my medical decision making (see chart for details).  This is a 46 year old female who comes into the hospital today with some cough and some chest pain with coughing. The patient does have some mild shortness of breath is gone on for 4-5 days. He has some diminished breath sounds throughout. I'll give the patient breathing treatment as well as some ibuprofen and benzonatate. The patient will receive a chest x-ray and I'll reassess the patient.  Clinical Course as of Oct 13 720  Tue Oct 13, 2016  0721 No acute cardiopulmonary abnormality. Mild stable interstitial prominence may reflect chronic bronchitic change.   DG Chest 2 View [AW]    Clinical Course User Index [AW] Rebecka ApleyAllison P Webster, MD   The patient does not have pneumonia. She appears to have some bronchitic changes on her chest x-ray which is consistent with bronchitis. I will send the patient home with azithromycin, prednisone, Tussionex and an inhaler. She should return with any worsening symptoms but should follow-up with the acute care  clinic.  ____________________________________________   FINAL CLINICAL IMPRESSION(S) / ED DIAGNOSES  Final diagnoses:  Bronchitis      NEW MEDICATIONS STARTED DURING THIS VISIT:  New Prescriptions   ALBUTEROL (PROVENTIL HFA;VENTOLIN HFA) 108 (90 BASE) MCG/ACT INHALER    Inhale 2 puffs into the lungs every 6 (six) hours as needed for wheezing or shortness of breath.   AZITHROMYCIN (ZITHROMAX Z-PAK) 250 MG TABLET    Take 2 tablets (500 mg) on  Day 1,  followed by 1 tablet (250 mg) once daily on Days 2 through 5.   CHLORPHENIRAMINE-HYDROCODONE (TUSSIONEX PENNKINETIC ER) 10-8 MG/5ML SUER    Take 5 mLs by mouth 2 (two) times daily.   PREDNISONE (DELTASONE) 20 MG TABLET    Take 3 tablets (60 mg total) by mouth daily.     Note:  This document was prepared using Dragon voice recognition software and may include unintentional dictation errors.    Rebecka ApleyAllison P Webster, MD 10/13/16 [phone]

## 2016-10-13 NOTE — ED Notes (Signed)
See triage note, per pt for the last 5 days she has had nasal drainage and cough with "clear and white" production. Pt denies fever or sore throat. Pt states she took "dayquil and nyquil" without relief.

## 2016-10-13 NOTE — Discharge Instructions (Signed)
Please follow up with the acute care clinic for further evaluation of the resolution of her symptoms. Should she have any worsening symptoms any fevers any inability to keep any fluids down please return to the emergency department for further evaluation of your cough.

## 2018-02-28 ENCOUNTER — Emergency Department: Payer: Self-pay

## 2018-02-28 ENCOUNTER — Emergency Department
Admission: EM | Admit: 2018-02-28 | Discharge: 2018-03-01 | Disposition: A | Discharge status: Home / Self Care | Payer: Self-pay | Attending: Emergency Medicine | Admitting: Emergency Medicine

## 2018-02-28 ENCOUNTER — Encounter: Payer: Self-pay | Admitting: Emergency Medicine

## 2018-02-28 ENCOUNTER — Other Ambulatory Visit: Payer: Self-pay

## 2018-02-28 DIAGNOSIS — R0789 Other chest pain: Secondary | ICD-10-CM | POA: Insufficient documentation

## 2018-02-28 DIAGNOSIS — R079 Chest pain, unspecified: Secondary | ICD-10-CM

## 2018-02-28 LAB — BASIC METABOLIC PANEL
Anion gap: 8 (ref 5–15)
BUN: 12 mg/dL (ref 6–20)
CHLORIDE: 102 mmol/L (ref 98–111)
CO2: 27 mmol/L (ref 22–32)
CREATININE: 0.75 mg/dL (ref 0.44–1.00)
Calcium: 9 mg/dL (ref 8.9–10.3)
GFR calc Af Amer: >60 mL/min (ref >60)
GFR calc non Af Amer: >60 mL/min (ref >60)
GLUCOSE: 151 mg/dL — AB (ref 70–99)
Potassium: 3.3 mmol/L — ABNORMAL LOW (ref 3.5–5.1)
Sodium: 137 mmol/L (ref 135–145)

## 2018-02-28 LAB — CBC
HCT: 36.7 % (ref 35.0–47.0)
Hemoglobin: 13 g/dL (ref 12.0–16.0)
MCH: 28.8 pg (ref 26.0–34.0)
MCHC: 35.5 g/dL (ref 32.0–36.0)
MCV: 81.1 fL (ref 80.0–100.0)
PLATELETS: 181 10*3/uL (ref 150–440)
RBC: 4.53 MIL/uL (ref 3.80–5.20)
RDW: 13.6 % (ref 11.5–14.5)
WBC: 3.7 10*3/uL (ref 3.6–11.0)

## 2018-02-28 LAB — TROPONIN I: Troponin I: <0.03 ng/mL (ref <0.03)

## 2018-02-28 NOTE — ED Provider Notes (Signed)
[facility] Emergency Department Provider Note       Time seen: ----------------------------------------- 8:09 PM on 02/28/2018 -----------------------------------------   I have reviewed the triage vital signs and the nursing notes.  HISTORY   Chief Complaint Chest Pain    HPI Ashley Stephenson is a 47 y.o. female with a history of atypical chest pain, edema who presents to the ED for chest pain that started yesterday.  Patient describes tightness in her chest, she states she might have some shortness of breath with it.  Nothing makes it better or worse.  She denies fevers, chills or other complaints.  History reviewed. No pertinent past medical history.  Patient Active Problem List   Diagnosis Date Noted  . Abnormal chest x-ray 06/05/2015  . Atypical chest pain 06/05/2015  . Edema extremities 06/05/2015  . Episodic tension type headache 06/05/2015  . Blood glucose elevated 06/05/2015  . Muscle cramp, nocturnal 06/05/2015    Past Surgical History:  Procedure Laterality Date  . TOTAL ABDOMINAL HYSTERECTOMY     bleeding    Allergies Penicillins  Social History Social History   Tobacco Use  . Smoking status: Never Smoker  . Smokeless tobacco: Never Used  Substance Use Topics  . Alcohol use: No    Alcohol/week: 0.0 oz  . Drug use: Never    Review of Systems Constitutional: Negative for fever. Cardiovascular: Positive for chest pain Respiratory: Negative for shortness of breath. Gastrointestinal: Negative for abdominal pain, vomiting and diarrhea. Musculoskeletal: Negative for back pain. Skin: Negative for rash. Neurological: Negative for headaches, focal weakness or numbness.  All systems negative/normal/unremarkable except as stated in the HPI  ____________________________________________   PHYSICAL EXAM:  VITAL SIGNS: ED Triage Vitals  Enc Vitals Group     BP 02/28/18 1852 (!) 168/73     Pulse Rate 02/28/18 1852 99      Resp 02/28/18 1852 18     Temp 02/28/18 1852 98.3 F (36.8 C)     Temp Source 02/28/18 1852 Oral     SpO2 02/28/18 1852 98 %     Weight 02/28/18 1853 281 lb (127.5 kg)     Height 02/28/18 1853 5\' 2"  (1.575 m)     Head Circumference --      Peak Flow --      Pain Score 02/28/18 1852 8     Pain Loc --      Pain Edu? --      Excl. in GC? --    Constitutional: Alert and oriented. Well appearing and in no distress. Eyes: Conjunctivae are normal. Normal extraocular movements. Cardiovascular: Normal rate, regular rhythm. No murmurs, rubs, or gallops. Respiratory: Normal respiratory effort without tachypnea nor retractions. Breath sounds are clear and equal bilaterally. No wheezes/rales/rhonchi. Gastrointestinal: Soft and nontender. Normal bowel sounds Musculoskeletal: Nontender with normal range of motion in extremities. No lower extremity tenderness nor edema. Neurologic:  Normal speech and language. No gross focal neurologic deficits are appreciated.  Skin:  Skin is warm, dry and intact. No rash noted. Psychiatric: Mood and affect are normal. Speech and behavior are normal.  ____________________________________________  EKG: Interpreted by me.  Sinus rhythm rate of 97 bpm, normal PR interval, normal QRS, normal QT  ____________________________________________  ED COURSE:  As part of my medical decision making, I reviewed the following data within the electronic MEDICAL RECORD NUMBER History obtained from family if available, nursing notes, old chart and ekg, as well as notes from prior ED visits. Patient presented for chest  pain, we will assess with labs and imaging as indicated at this time.   Procedures ____________________________________________   LABS (pertinent positives/negatives)  Labs Reviewed  BASIC METABOLIC PANEL - Abnormal; Notable for the following components:      Result Value   Potassium 3.3 (*)    Glucose, Bld 151 (*)    All other components within normal limits   CBC  TROPONIN I  POC URINE PREG, ED    RADIOLOGY  Chest x-ray is unremarkable  ____________________________________________  DIFFERENTIAL DIAGNOSIS   Musculoskeletal pain, anxiety, GERD, unstable angina unlikely  FINAL ASSESSMENT AND PLAN  Atypical chest pain   Plan: The patient had presented for nonspecific chest pain. Patient's labs were unremarkable. Patient's imaging was also negative.  This appears to be noncardiac chest pain.  She is cleared for outpatient follow-up.   Ulice DashJohnathan E Williams, MD   Note: This note was generated in part or whole with voice recognition software. Voice recognition is usually quite accurate but there are transcription errors that can and very often do occur. I apologize for any typographical errors that were not detected and corrected.     Emily FilbertWilliams, Jonathan E, MD 02/28/18 2110

## 2018-02-28 NOTE — ED Triage Notes (Signed)
Pt presents with tightness in her chest since yesterday. States she "might" have some sob with it. Pt alert & oriented with NAD noted.

## 2018-03-02 ENCOUNTER — Emergency Department
Admission: EM | Admit: 2018-03-02 | Discharge: 2018-03-02 | Disposition: A | Discharge status: Home / Self Care | Payer: Self-pay | Attending: Emergency Medicine | Admitting: Emergency Medicine

## 2018-03-02 ENCOUNTER — Other Ambulatory Visit: Payer: Self-pay

## 2018-03-02 DIAGNOSIS — Z79899 Other long term (current) drug therapy: Secondary | ICD-10-CM | POA: Insufficient documentation

## 2018-03-02 DIAGNOSIS — R42 Dizziness and giddiness: Secondary | ICD-10-CM | POA: Insufficient documentation

## 2018-03-02 MED ORDER — ACETAMINOPHEN 325 MG PO TABS
650.0000 mg | ORAL_TABLET | Freq: Once | ORAL | Status: AC
  Administered 2018-03-02: 650 mg via ORAL
  Filled 2018-03-02: qty 2

## 2018-03-02 NOTE — ED Notes (Signed)
Pt refused bloodwork, states "I just had that done 2 days ago and they told me nothing was wrong."

## 2018-03-02 NOTE — ED Triage Notes (Signed)
Pt arrives to ED c/o of dizziness that began today. C/o HA. Denies vision changes. States no hx of HTN but checked BP and it was 176. Alert, oriented, speaking in complete sentences. No distress noted. Saw UC and BP was lower then (152/95).

## 2018-03-02 NOTE — ED Notes (Signed)
Patient discharged to home per MD order. Patient in stable condition, and deemed medically cleared by ED provider for discharge. Discharge instructions reviewed with patient/family using "Teach Back"; verbalized understanding of medication education and administration, and information about follow-up care. Denies further concerns. ° °

## 2018-03-02 NOTE — Discharge Instructions (Addendum)
Prefer not to have any testing done which is certainly your choice but limits my ability to evaluate you.  Please follow closely with primary care doctor.

## 2018-03-02 NOTE — ED Notes (Signed)
MD at bedside. Pt refusing blood testing and urine testing. Pt requesting discharge.

## 2018-03-02 NOTE — ED Notes (Signed)
Pt in with co dizziness since this am no hx of the same or recent illness. Pt states checked her bp at work and was 170s/80's and became concerned. No hx of htn, was here recently for cp all tests were wnl. Pt denies any cp today.

## 2018-03-02 NOTE — ED Provider Notes (Signed)
[facility] Emergency Department Provider Note  ____________________________________________   I have reviewed the triage vital signs and the nursing notes. Where available I have reviewed prior notes and, if possible and indicated, outside hospital notes.    HISTORY  Chief Complaint I am stressed and I feel lightheaded   HPI Ashley Stephenson is a 47 y.o. female  Who states she is under a great deal of only stress.  She refuses to tell me what is going on which is certainly her prerogative.  She denies being abused she has no SI or HI.  She states that she was here 2 days ago she was having some chest discomfort that is completely gone away but now she feels somewhat lightheaded.  She denies any chest pain at this time.  She states the reason she came in as she works at [employer] and checked her blood pressure and it was high and the more she checked at the higher got so she came into be evaluated.  She did not take any extra medications.  She denies any chest pain shortness of breath nausea vomiting.  She states she has a slight headache which is nearly gone.  She states she is been feeling a little bit lightheaded.  She states she is drinking plenty of fluids and she denies dysuria urinary frequency or pregnancy.  She denies melena or bright red blood per rectum or any other complaint.  She states she just felt a little lightheaded.  She is adamant that she does not want any further work-up.  She would like to be discharged. **   History reviewed. No pertinent past medical history.  Patient Active Problem List   Diagnosis Date Noted  . Abnormal chest x-ray 06/05/2015  . Atypical chest pain 06/05/2015  . Edema extremities 06/05/2015  . Episodic tension type headache 06/05/2015  . Blood glucose elevated 06/05/2015  . Muscle cramp, nocturnal 06/05/2015    Past Surgical History:  Procedure Laterality Date  . TOTAL ABDOMINAL HYSTERECTOMY     bleeding    Prior to  Admission medications   Medication Sig Start Date End Date Taking? Authorizing Provider  albuterol (PROVENTIL HFA;VENTOLIN HFA) 108 (90 Base) MCG/ACT inhaler Inhale 2 puffs into the lungs every 6 (six) hours as needed for wheezing or shortness of breath. 10/13/16   Rebecka Apley, MD  chlorpheniramine-HYDROcodone [facility] PENNKINETIC ER) 10-8 MG/5ML SUER Take 5 mLs by mouth 2 (two) times daily. 10/13/16   Rebecka Apley, MD  predniSONE (DELTASONE) 20 MG tablet Take 3 tablets (60 mg total) by mouth daily. 10/13/16   Rebecka Apley, MD    Allergies Penicillins  Family History  Problem Relation Age of Onset  . CAD Mother     Social History Social History   Tobacco Use  . Smoking status: Never Smoker  . Smokeless tobacco: Never Used  Substance Use Topics  . Alcohol use: No    Alcohol/week: 0.0 oz  . Drug use: Never    Review of Systems Constitutional: No fever/chills Eyes: No visual changes. ENT: No sore throat. No stiff neck no neck pain Cardiovascular: Denies chest pain. Respiratory: Denies shortness of breath. Gastrointestinal:   no vomiting.  No diarrhea.  No constipation. Genitourinary: Negative for dysuria. Musculoskeletal: Negative lower extremity swelling Skin: Negative for rash. Neurological: Negative for severe headaches, focal weakness or numbness.   ____________________________________________   PHYSICAL EXAM:  VITAL SIGNS: ED Triage Vitals  Enc Vitals Group     BP 03/02/18  1838 (!) 145/58     Pulse Rate 03/02/18 1838 91     Resp 03/02/18 1951 18     Temp 03/02/18 1838 98.8 F (37.1 C)     Temp Source 03/02/18 1838 Oral     SpO2 03/02/18 1838 97 %     Weight --      Height --      Head Circumference --      Peak Flow --      Pain Score 03/02/18 1850 0     Pain Loc --      Pain Edu? --      Excl. in GC? --     Constitutional: Alert and oriented. Well appearing and in no acute distress. Eyes: Conjunctivae are normal Head:  Atraumatic HEENT: No congestion/rhinnorhea. Mucous membranes are moist.  Oropharynx non-erythematous Neck:   Nontender with no meningismus, no masses, no stridor Cardiovascular: Normal rate, regular rhythm. Grossly normal heart sounds.  Good peripheral circulation. Respiratory: Normal respiratory effort.  No retractions. Lungs CTAB. Abdominal: Soft and nontender. No distention. No guarding no rebound Back:  There is no focal tenderness or step off.  there is no midline tenderness there are no lesions noted. there is no CVA tenderness Musculoskeletal: No lower extremity tenderness, no upper extremity tenderness. No joint effusions, no DVT signs strong distal pulses no edema Neurologic:  Normal speech and language. No gross focal neurologic deficits are appreciated.  Skin:  Skin is warm, dry and intact. No rash noted. Psychiatric: Mood and affect are normal. Speech and behavior are normal.  ____________________________________________   LABS (all labs ordered are listed, but only abnormal results are displayed)  Labs Reviewed  BASIC METABOLIC PANEL  CBC  URINALYSIS, COMPLETE (UACMP) WITH MICROSCOPIC  CBG MONITORING, ED  POC URINE PREG, ED    Pertinent labs  results that were available during my care of the patient were reviewed by me and considered in my medical decision making (see chart for details). ____________________________________________  EKG  I personally interpreted any EKGs ordered by me or triage Sinus rhythm rate 90 bpm no acute ST elevation or depression normal axis unremarkable EKG ____________________________________________  RADIOLOGY  Pertinent labs & imaging results that were available during my care of the patient were reviewed by me and considered in my medical decision making (see chart for details). If possible, patient and/or family made aware of any abnormal findings.  No results found. ____________________________________________     PROCEDURES  Procedure(s) performed: None  Procedures  Critical Care performed: None  ____________________________________________   INITIAL IMPRESSION / ASSESSMENT AND PLAN / ED COURSE  Pertinent labs & imaging results that were available during my care of the patient were reviewed by me and considered in my medical decision making (see chart for details).  Patient here stating he feels feels somewhat lightheaded.  However she was able to get off the bed walk to the bathroom with no distress or discomfort noted no evidence of gait disturbance, vital signs are correcting as she is relaxing here.  She states that she thinks she was just stressed.  I have offered and advised that we check a urinalysis urine pregnancy test and multiple test of her blood work etc. to reestablish that there is nothing else acutely going on and she refuses.  She understands that without these tests there is nothing I can do to verify or confirm that there is no other pathology present.  However, she is adamant she does not want a needlestick  she does not want to stay for urinalysis she does not want IV fluid she does not want anything done she just want some Tylenol she wants to go home.  I feel that she has medical decision-making capacity.  She states she was distressed and was worried about her blood pressure but that is gone she wants to go.  She understands any time she can return for new or worrisome symptoms and she understands the limitations that she is placing upon me in terms of my ability to work her up.  Return precautions and follow-up given and understood and she will follow close with primary care doctor.  She actually had an appointment with her doctor today but failed to go.  I have stressed the need to actually follow-up.    ____________________________________________   FINAL CLINICAL IMPRESSION(S) / ED DIAGNOSES  Final diagnoses:  None      This chart was dictated using voice  recognition software.  Despite best efforts to proofread,  errors can occur which can change meaning.      Jeanmarie PlantMcShane, Jaedah Lords A, MD 03/02/18 2047

## 2018-03-02 NOTE — ED Notes (Signed)
Pt states she does not want blood work or ua done at this time "states I just had it done a few days ago".

## 2018-03-29 DIAGNOSIS — I10 Essential (primary) hypertension: Secondary | ICD-10-CM | POA: Insufficient documentation

## 2018-09-09 ENCOUNTER — Emergency Department: Payer: BLUE CROSS/BLUE SHIELD

## 2018-09-09 ENCOUNTER — Emergency Department
Admission: EM | Admit: 2018-09-09 | Discharge: 2018-09-09 | Disposition: A | Discharge status: Home / Self Care | Payer: BLUE CROSS/BLUE SHIELD | Attending: Emergency Medicine | Admitting: Emergency Medicine

## 2018-09-09 ENCOUNTER — Other Ambulatory Visit: Payer: Self-pay

## 2018-09-09 DIAGNOSIS — I10 Essential (primary) hypertension: Secondary | ICD-10-CM | POA: Insufficient documentation

## 2018-09-09 DIAGNOSIS — H9203 Otalgia, bilateral: Secondary | ICD-10-CM | POA: Diagnosis present

## 2018-09-09 DIAGNOSIS — R070 Pain in throat: Secondary | ICD-10-CM | POA: Diagnosis not present

## 2018-09-09 DIAGNOSIS — R030 Elevated blood-pressure reading, without diagnosis of hypertension: Secondary | ICD-10-CM | POA: Insufficient documentation

## 2018-09-09 DIAGNOSIS — R05 Cough: Secondary | ICD-10-CM | POA: Insufficient documentation

## 2018-09-09 DIAGNOSIS — J40 Bronchitis, not specified as acute or chronic: Secondary | ICD-10-CM | POA: Diagnosis not present

## 2018-09-09 DIAGNOSIS — R0981 Nasal congestion: Secondary | ICD-10-CM | POA: Diagnosis not present

## 2018-09-09 HISTORY — DX: Essential (primary) hypertension: I10

## 2018-09-09 HISTORY — DX: Hyperlipidemia, unspecified: E78.5

## 2018-09-09 MED ORDER — PREDNISONE 10 MG PO TABS: ORAL_TABLET | ORAL | 0 refills | Status: DC

## 2018-09-09 MED ORDER — AZITHROMYCIN 250 MG PO TABS: ORAL_TABLET | ORAL | 0 refills | Status: DC

## 2018-09-09 NOTE — ED Triage Notes (Signed)
Pt c/o cough with sinus and chest congestion with a sore throat for the past month, was seen at [facility] 1/30 and PCP on 1/10 and dx with URI/. Pt is in NAD.

## 2018-09-09 NOTE — ED Notes (Signed)
See triage note  Presents wit a 3 week hx of cough,fever,body aches and ear pain  States she was seen a [facility]  And dx'd with the flu  States she has cont's with cough  Afebrile on arrival

## 2018-09-09 NOTE — ED Provider Notes (Signed)
[facility] Emergency Department Provider Note  ____________________________________________  Time seen: Approximately 11:01 AM  I have reviewed the triage vital signs and the nursing notes.   HISTORY  Chief Complaint URI    HPI Ashley Stephenson is a 48 y.o. female that presents to the emergency department for evaluation of ear pain, nasal congestion, sore throat, productive cough with yellow and dark sputum for 1 month.  Pain is worse with coughing.  Patient states that she was seen [facility] and was diagnosed with influenza 1 week ago.  Symptoms have not improved.  She does not smoke cigarettes.  No fever, shortness of breath, abdominal pain.  Past Medical History:  Diagnosis Date  . Hyperlipemia   . Hypertension     Patient Active Problem List   Diagnosis Date Noted  . Abnormal chest x-ray 06/05/2015  . Atypical chest pain 06/05/2015  . Edema extremities 06/05/2015  . Episodic tension type headache 06/05/2015  . Blood glucose elevated 06/05/2015  . Muscle cramp, nocturnal 06/05/2015    Past Surgical History:  Procedure Laterality Date  . TOTAL ABDOMINAL HYSTERECTOMY     bleeding    Prior to Admission medications   Medication Sig Start Date End Date Taking? Authorizing Provider  albuterol (PROVENTIL HFA;VENTOLIN HFA) 108 (90 Base) MCG/ACT inhaler Inhale 2 puffs into the lungs every 6 (six) hours as needed for wheezing or shortness of breath. 10/13/16   Rebecka ApleyWebster, Allison P, MD  azithromycin (ZITHROMAX Z-PAK) 250 MG tablet Take 2 tablets (500 mg) on  Day 1,  followed by 1 tablet (250 mg) once daily on Days 2 through 5. 09/09/18   Enid DerryWagner, Bowman Higbie, PA-C  chlorpheniramine-HYDROcodone (TUSSIONEX PENNKINETIC ER) 10-8 MG/5ML SUER Take 5 mLs by mouth 2 (two) times daily. 10/13/16   Rebecka ApleyWebster, Allison P, MD  predniSONE (DELTASONE) 10 MG tablet Take 6 tablets day 1, take 5 tablets day 2, take 4 tablets day 3, take 3 tablets day 4, take 2 tablets day 5, take 1 tablet day  6 09/09/18   Enid DerryWagner, Falynn Ailey, PA-C    Allergies Penicillins  Family History  Problem Relation Age of Onset  . CAD Mother     Social History Social History   Tobacco Use  . Smoking status: Never Smoker  . Smokeless tobacco: Never Used  Substance Use Topics  . Alcohol use: No    Alcohol/week: 0.0 standard drinks  . Drug use: Never     Review of Systems  Constitutional: No fever/chills Eyes: No visual changes. No discharge. ENT: Positive for congestion and rhinorrhea. Respiratory: Positive for cough. No SOB. Gastrointestinal: No abdominal pain.   Musculoskeletal: Negative for musculoskeletal pain. Skin: Negative for rash, abrasions, lacerations, ecchymosis. Neurological: Negative for headaches.   ____________________________________________   PHYSICAL EXAM:  VITAL SIGNS: ED Triage Vitals  Enc Vitals Group     BP 09/09/18 1055 (!) 153/100     Pulse Rate 09/09/18 1055 94     Resp 09/09/18 1055 18     Temp 09/09/18 1055 98.5 F (36.9 C)     Temp Source 09/09/18 1055 Oral     SpO2 09/09/18 1055 97 %     Weight 09/09/18 1056 270 lb (122.5 kg)     Height 09/09/18 1056 5\' 2"  (1.575 m)     Head Circumference --      Peak Flow --      Pain Score 09/09/18 1056 10     Pain Loc --      Pain Edu? --  Excl. in GC? --      Constitutional: Alert and oriented. Well appearing and in no acute distress. Eyes: Conjunctivae are normal. PERRL. EOMI. No discharge. Head: Atraumatic. ENT: No frontal and maxillary sinus tenderness.      Ears: Tympanic membranes pearly gray with good landmarks. No discharge.      Nose: Mild congestion/rhinnorhea.      Mouth/Throat: Mucous membranes are moist. Oropharynx non-erythematous. Tonsils not enlarged. No exudates. Uvula midline. Neck: No stridor.   Hematological/Lymphatic/Immunilogical: No cervical lymphadenopathy. Cardiovascular: Normal rate, regular rhythm.  Good peripheral circulation. Respiratory: Normal respiratory effort without  tachypnea or retractions. Lungs CTAB. Good air entry to the bases with no decreased or absent breath sounds. Gastrointestinal: Bowel sounds 4 quadrants. Soft and nontender to palpation. No guarding or rigidity. No palpable masses. No distention. Musculoskeletal: Full range of motion to all extremities. No gross deformities appreciated.  Neurologic:  Normal speech and language. No gross focal neurologic deficits are appreciated.  Skin:  Skin is warm, dry and intact. No rash noted. Psychiatric: Mood and affect are normal. Speech and behavior are normal. Patient exhibits appropriate insight and judgement.   ____________________________________________   LABS (all labs ordered are listed, but only abnormal results are displayed)  Labs Reviewed - No data to display ____________________________________________  EKG   ____________________________________________  RADIOLOGY Lexine Baton, personally viewed and evaluated these images (plain radiographs) as part of my medical decision making, as well as reviewing the written report by the radiologist.  Dg Chest 2 View  Result Date: 09/09/2018 CLINICAL DATA:  Cough and congestion with sore throat EXAM: CHEST - 2 VIEW COMPARISON:  February 28, 2018 FINDINGS: Lungs are clear. Heart size and pulmonary vascularity are normal. No adenopathy. No bone lesions. IMPRESSION: No edema or consolidation. Electronically Signed   By: Bretta Bang III M.D.   On: 09/09/2018 11:21    ____________________________________________    PROCEDURES  Procedure(s) performed:    Procedures    Medications - No data to display   ____________________________________________   INITIAL IMPRESSION / ASSESSMENT AND PLAN / ED COURSE  Pertinent labs & imaging results that were available during my care of the patient were reviewed by me and considered in my medical decision making (see chart for details).  Review of the [place] CSRS was performed in accordance of  the [place] prior to dispensing any controlled drugs.   Patient's diagnosis is consistent with bronchitis. Vital signs and exam are reassuring.  Chest x-ray negative for acute cardiopulmonary processes.  Patient presented to emergency department with an elevated blood pressure.  Blood pressure was 122/50 prior to discharge.  Education about blood pressure was given to the patient.  She is agreeable to follow-up with primary care for recheck.  Prior to discharge, patient states that the front of her has been bothering her.  No trauma.  Imaging no further testing was offered and patient declines and does not want to wait any longer.  Patient is drinking water in the emergency department.  Patient appears well and is staying well hydrated .Patient feels comfortable going home. Patient will be discharged home with prescriptions for prednisone and azithromycin. Patient is to follow up with primary care as needed or otherwise directed. Patient is given ED precautions to return to the ED for any worsening or new symptoms.     ____________________________________________  FINAL CLINICAL IMPRESSION(S) / ED DIAGNOSES  Final diagnoses:  Bronchitis  Elevated blood pressure reading      NEW MEDICATIONS STARTED DURING  THIS VISIT:  ED Discharge Orders         Ordered    azithromycin (ZITHROMAX Z-PAK) 250 MG tablet     09/09/18 1159    predniSONE (DELTASONE) 10 MG tablet     09/09/18 1235              This chart was dictated using voice recognition software/Dragon. Despite best efforts to proofread, errors can occur which can change the meaning. Any change was purely unintentional.    Enid Derry, PA-C 09/09/18 1641    Jene Every, MD 09/12/18 [phone]

## 2018-09-09 NOTE — Discharge Instructions (Addendum)
Your chest x-ray does not show any pneumonia.  Your symptoms are consistent with bronchitis.  You had an elevated blood pressure reading in the emergency department.  Please follow-up with primary care for recheck.  You can begin antibiotics and steroids for bronchitis.  Please follow-up with primary care in 3 days for recheck.  Return to the emergency department for any worsening of symptoms.

## 2019-01-27 ENCOUNTER — Encounter (INDEPENDENT_AMBULATORY_CARE_PROVIDER_SITE_OTHER): Payer: BC Managed Care – PPO | Admitting: Vascular Surgery

## 2019-02-10 ENCOUNTER — Ambulatory Visit: Payer: BC Managed Care – PPO | Attending: Family Medicine | Admitting: Physical Therapy

## 2019-02-10 ENCOUNTER — Other Ambulatory Visit: Payer: Self-pay

## 2019-02-10 ENCOUNTER — Encounter: Payer: Self-pay | Admitting: Physical Therapy

## 2019-02-10 DIAGNOSIS — M79605 Pain in left leg: Secondary | ICD-10-CM | POA: Insufficient documentation

## 2019-02-10 DIAGNOSIS — M79604 Pain in right leg: Secondary | ICD-10-CM | POA: Insufficient documentation

## 2019-02-20 NOTE — Therapy (Signed)
[facility] [address] [place], [place], [address] Phone: [phone]   Fax:  [phone]  Physical Therapy Evaluation  Patient Details  Name: Ashley Stephenson MRN: [id number] Date of Birth: July 01, 1971 No data recorded  Encounter Date: 02/10/2019    Past Medical History:  Diagnosis Date  . Hyperlipemia   . Hypertension     Past Surgical History:  Procedure Laterality Date  . TOTAL ABDOMINAL HYSTERECTOMY     bleeding    There were no vitals filed for this visit.      Plan - 02/20/19 1142    Clinical Impression Statement  Pt. no showed for scheduled FCE.  Chart opened in error.       Patient will benefit from skilled therapeutic intervention in order to improve the following deficits and impairments:     Visit Diagnosis: 1. Bilateral leg pain        Problem List Patient Active Problem List   Diagnosis Date Noted  . Abnormal chest x-ray 06/05/2015  . Atypical chest pain 06/05/2015  . Edema extremities 06/05/2015  . Episodic tension type headache 06/05/2015  . Blood glucose elevated 06/05/2015  . Muscle cramp, nocturnal 06/05/2015   Pura Spice, PT, DPT # [phone] 02/20/2019, 11:43 AM  [facility] [address] [place], [place], [address] Phone: [phone]   Fax:  [phone]  Name: Ashley Stephenson MRN: [id number] Date of Birth: [birth year]

## 2019-03-07 ENCOUNTER — Encounter (INDEPENDENT_AMBULATORY_CARE_PROVIDER_SITE_OTHER): Payer: BC Managed Care – PPO | Admitting: Vascular Surgery

## 2019-04-28 ENCOUNTER — Encounter (INDEPENDENT_AMBULATORY_CARE_PROVIDER_SITE_OTHER): Payer: Self-pay | Admitting: Vascular Surgery

## 2019-05-29 ENCOUNTER — Ambulatory Visit: Payer: Self-pay

## 2019-10-10 IMAGING — CR DG CHEST 2V
1 series · 2 of 2 positions shown · non-contrast
Comparison: 10/13/2016.

CLINICAL DATA: Chest tightness

EXAM:
CHEST - 2 VIEW

[Series 1: w chest pa · 0.14mm/px · 2 of 2 slices shown]
[im 1/2]
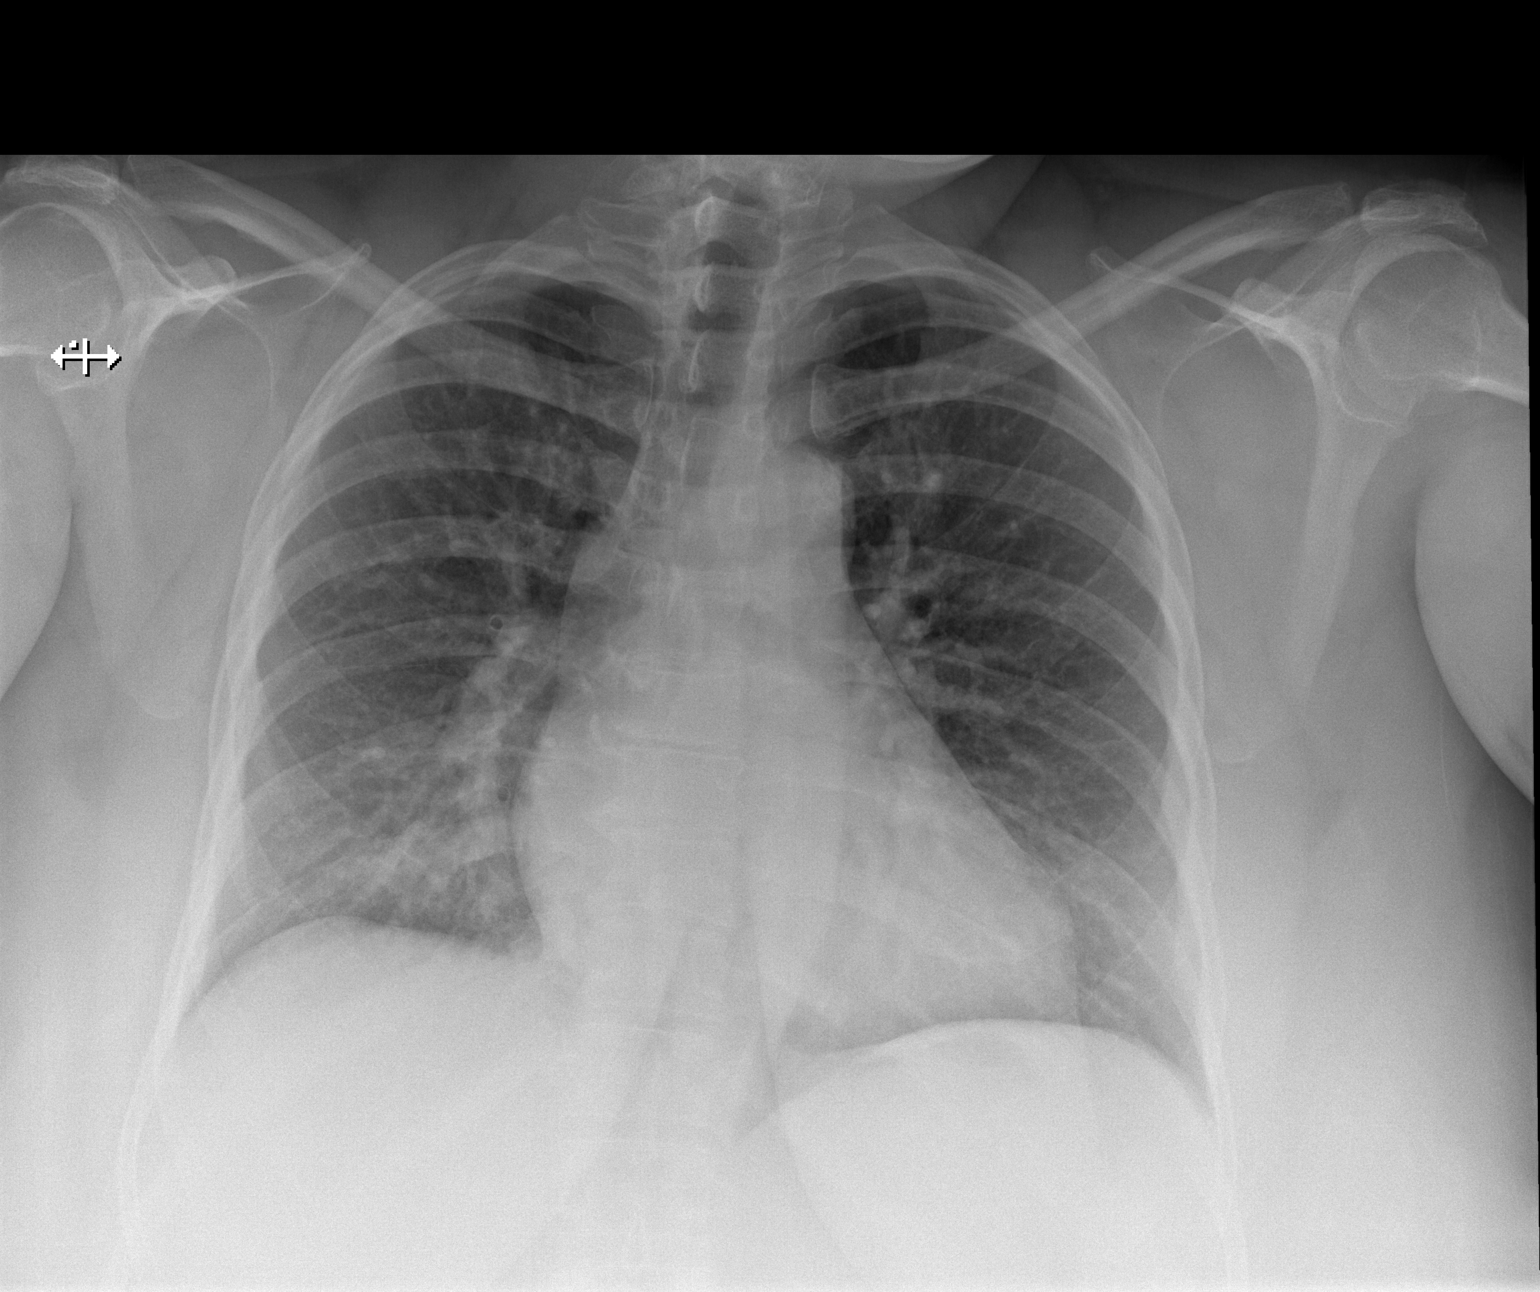
[im 2/2]
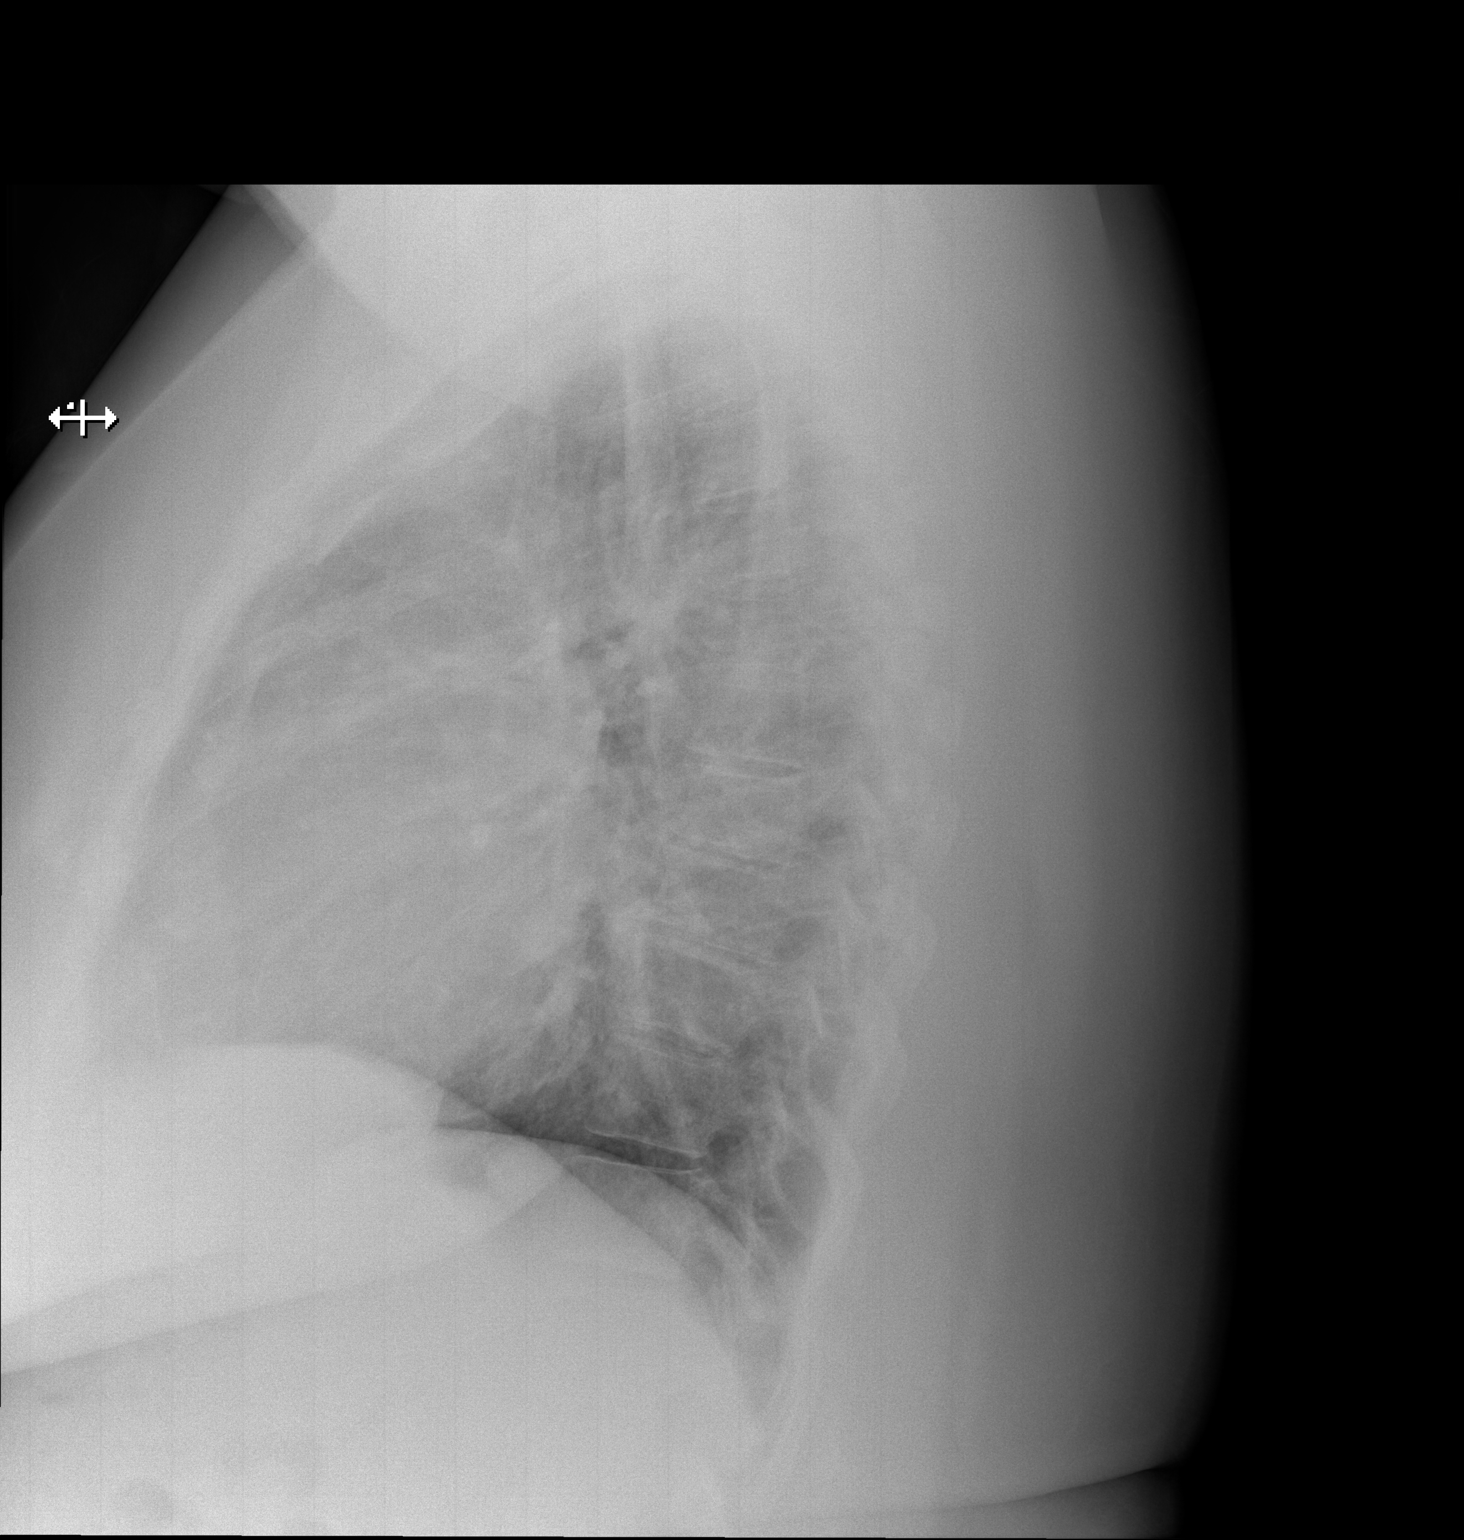

[2 of 2 positions shown; findings below may reference images not displayed]

FINDINGS: Mild right basilar atelectasis. No frank interstitial edema. No
pleural effusion or pneumothorax.

The heart is top-normal in size.

Visualized osseous structures are within normal limits.
IMPRESSION: No evidence of acute cardiopulmonary disease.

## 2022-02-16 ENCOUNTER — Ambulatory Visit: Payer: Self-pay | Admitting: Advanced Practice Midwife

## 2022-02-16 ENCOUNTER — Encounter: Payer: Self-pay | Admitting: Advanced Practice Midwife

## 2022-02-16 DIAGNOSIS — Z90710 Acquired absence of both cervix and uterus: Secondary | ICD-10-CM | POA: Insufficient documentation

## 2022-02-16 DIAGNOSIS — Z113 Encounter for screening for infections with a predominantly sexual mode of transmission: Secondary | ICD-10-CM

## 2022-02-16 DIAGNOSIS — N760 Acute vaginitis: Secondary | ICD-10-CM

## 2022-02-16 DIAGNOSIS — B9689 Other specified bacterial agents as the cause of diseases classified elsewhere: Secondary | ICD-10-CM

## 2022-02-16 LAB — WET PREP FOR TRICH, YEAST, CLUE
Trichomonas Exam: NEGATIVE
Yeast Exam: NEGATIVE

## 2022-02-16 LAB — HM HIV SCREENING LAB: HM HIV Screening: NEGATIVE

## 2022-02-16 MED ORDER — METRONIDAZOLE 500 MG PO TABS: 500.0000 mg | ORAL_TABLET | Freq: Two times a day (BID) | ORAL | 0 refills | Status: AC

## 2022-02-16 NOTE — Progress Notes (Signed)
[facility] Department  STI clinic/screening visit [address] [place] [address] [phone]  Subjective:  Ashley Stephenson is a 51 y.o. SBF G2P2 nonsmoker female being seen today for an STI screening visit. The patient reports they do not have symptoms.  Patient reports that they do not desire a pregnancy in the next year.   They reported they are not interested in discussing contraception today.    No LMP recorded. Patient has had a hysterectomy.   Patient has the following medical conditions:   Patient Active Problem List   Diagnosis Date Noted   Morbid obesity (HCC) 270 lbs 02/16/2022   Abnormal chest x-ray 06/05/2015   Atypical chest pain 06/05/2015   Edema extremities 06/05/2015   Episodic tension type headache 06/05/2015   Blood glucose elevated 06/05/2015   Muscle cramp, nocturnal 06/05/2015    Chief Complaint  Patient presents with   SEXUALLY TRANSMITTED DISEASE    HPI  Patient reports her partner has discharge from penis. Hysterectomy 2008. Last sex 02/13/22 without condom; with current partner x 14 mo. Also sex with another partner 02/06/22 without condom; with that partner 2nd time.   Last HIV test per patient/review of record was unsure Patient reports last pap was 2022 wnl  Screening for MPX risk: Does the patient have an unexplained rash? No Is the patient MSM? No Does the patient endorse multiple sex partners or anonymous sex partners? Yes Did the patient have close or sexual contact with a person diagnosed with MPX? No Has the patient traveled outside the [place] where MPX is endemic? No Is there a high clinical suspicion for MPX-- evidenced by one of the following No  -Unlikely to be chickenpox  -Lymphadenopathy  -Rash that present in same phase of evolution on any given body part See flowsheet for further details and programmatic requirements.   Immunization history:   There is no immunization history on file for this patient.    The following portions of the patient's history were reviewed and updated as appropriate: allergies, current medications, past medical history, past social history, past surgical history and problem list.  Objective:  There were no vitals filed for this visit.  Physical Exam Vitals and nursing note reviewed.  Constitutional:      Appearance: Normal appearance. She is obese.  HENT:     Head: Normocephalic and atraumatic.     Mouth/Throat:     Mouth: Mucous membranes are moist.     Pharynx: Oropharynx is clear. No oropharyngeal exudate or posterior oropharyngeal erythema.  Eyes:     Conjunctiva/sclera: Conjunctivae normal.  Pulmonary:     Effort: Pulmonary effort is normal.  Abdominal:     Palpations: Abdomen is soft. There is no mass.     Tenderness: There is no abdominal tenderness. There is no rebound.     Comments: Increased adipose, poor tone, softw without masses or tenderness  Genitourinary:    General: Normal vulva.     Exam position: Lithotomy position.     Pubic Area: No rash or pubic lice.      Labia:        Right: No rash or lesion.        Left: No rash or lesion.      Vagina: Vaginal discharge (white malodorous leukorrhea, ph>4.5) present. No erythema, bleeding or lesions.     Rectum: Normal.     Comments: pH = >4.5 Uterus surgically removed 2008 Lymphadenopathy:     Head:  Right side of head: No preauricular or posterior auricular adenopathy.     Left side of head: No preauricular or posterior auricular adenopathy.     Cervical: No cervical adenopathy.     Right cervical: No superficial, deep or posterior cervical adenopathy.    Left cervical: No superficial, deep or posterior cervical adenopathy.     Upper Body:     Right upper body: No supraclavicular, axillary or epitrochlear adenopathy.     Left upper body: No supraclavicular, axillary or epitrochlear adenopathy.     Lower Body: No right inguinal adenopathy. No left inguinal adenopathy.  Skin:     General: Skin is warm and dry.     Findings: No rash.  Neurological:     Mental Status: She is alert and oriented to person, place, and time.     Assessment and Plan:  [name] is a 51 y.o. female presenting to the [facility] Department for STI screening  1. Screening examination for venereal disease Treat wet mount per standing orders Immunization nurse consult  - WET PREP FOR TRICH, YEAST, CLUE - HIV [place] [facility] - Chlamydia/Gonorrhea [place] [facility] - Syphilis Serology, [facility]  2. Morbid obesity (HCC) 270 lbs      Return if symptoms worsen or fail to improve.  No future appointments.  Alberteen Spindle, CNM

## 2022-02-16 NOTE — Progress Notes (Signed)
Wet prep reviewed by Hazle Coca CNM and treated for BV per standing order by verbal order of Ms. Sciora CNM. Jossie Ng, RN

## 2022-02-18 ENCOUNTER — Emergency Department: Payer: BC Managed Care – PPO

## 2022-02-18 ENCOUNTER — Encounter: Payer: Self-pay | Admitting: *Deleted

## 2022-02-18 ENCOUNTER — Other Ambulatory Visit: Payer: Self-pay

## 2022-02-18 ENCOUNTER — Emergency Department
Admission: EM | Admit: 2022-02-18 | Discharge: 2022-02-18 | Disposition: A | Discharge status: Home / Self Care | Payer: BC Managed Care – PPO | Attending: Emergency Medicine | Admitting: Emergency Medicine

## 2022-02-18 DIAGNOSIS — I10 Essential (primary) hypertension: Secondary | ICD-10-CM | POA: Diagnosis not present

## 2022-02-18 DIAGNOSIS — M5432 Sciatica, left side: Secondary | ICD-10-CM

## 2022-02-18 DIAGNOSIS — M5137 Other intervertebral disc degeneration, lumbosacral region: Secondary | ICD-10-CM | POA: Insufficient documentation

## 2022-02-18 DIAGNOSIS — M5442 Lumbago with sciatica, left side: Secondary | ICD-10-CM | POA: Insufficient documentation

## 2022-02-18 MED ORDER — CYCLOBENZAPRINE HCL 5 MG PO TABS: 5.0000 mg | ORAL_TABLET | Freq: Three times a day (TID) | ORAL | 0 refills | Status: DC | PRN

## 2022-02-18 MED ORDER — PREDNISONE 20 MG PO TABS: 40.0000 mg | ORAL_TABLET | Freq: Every day | ORAL | 0 refills | Status: AC

## 2022-02-18 MED ORDER — IBUPROFEN 800 MG PO TABS
800.0000 mg | ORAL_TABLET | Freq: Once | ORAL | Status: AC
  Administered 2022-02-18: 800 mg via ORAL
  Filled 2022-02-18: qty 1

## 2022-02-18 MED ORDER — DICLOFENAC SODIUM 75 MG PO TBEC: 75.0000 mg | DELAYED_RELEASE_TABLET | Freq: Two times a day (BID) | ORAL | 0 refills | Status: AC

## 2022-02-18 NOTE — ED Provider Notes (Signed)
[facility] Emergency Department Provider Note     Event Date/Time   First MD Initiated Contact with Patient 02/18/22 2037     (approximate)   History   Back Pain   HPI  Ashley Stephenson is a 51 y.o. female history of lower extremity edema, prediabetes, morbid obesity, and hypertension, presents to the ED with back pain with some referral into the left leg.  Patient reports onset of symptoms today but she denies any preceding injury, trauma, fall.  Denies any bladder or bowel incontinence, foot drop, or saddle anesthesias.  She does localize pain to his posterior left buttocks and thigh.     Physical Exam   Triage Vital Signs: ED Triage Vitals  Enc Vitals Group     BP 02/18/22 1958 (!) 158/88     Pulse Rate 02/18/22 1958 87     Resp 02/18/22 1958 18     Temp 02/18/22 1958 99.3 F (37.4 C)     Temp Source 02/18/22 1958 Oral     SpO2 02/18/22 1958 97 %     Weight 02/18/22 1958 270 lb (122.5 kg)     Height 02/18/22 1958 5\' 2"  (1.575 m)     Head Circumference --      Peak Flow --      Pain Score 02/18/22 2003 10     Pain Loc --      Pain Edu? --      Excl. in GC? --     Most recent vital signs: Vitals:   02/18/22 1958 02/18/22 2259  BP: (!) 158/88   Pulse: 87 78  Resp: 18 18  Temp: 99.3 F (37.4 C) 98.4 F (36.9 C)  SpO2: 97% 100%    General Awake, no distress.  CV:  Good peripheral perfusion.  RESP:  Normal effort.  ABD:  No distention.  MSK:  Normal spinal alignment without midline tenderness, spasm, deformity, or step-off.  Patient with slow transition from supine to sit.  Normal internal/external rotation of the left hip.  Tender palpation along the posterior chain of the left lower extremity. NEURO: Cranial nerves II to XII grossly intact.  Normal LE DTRs bilaterally.   ED Results / Procedures / Treatments   Labs (all labs ordered are listed, but only abnormal results are displayed) Labs Reviewed - No data to  display   EKG    RADIOLOGY  I personally viewed and evaluated these images as part of my medical decision making, as well as reviewing the written report by the radiologist.  ED Provider Interpretation: no acute findings}  DG Lumbar Spine   IMPRESSION: 1. Grade 1 anterolisthesis of L4 on L5 with moderate facet hypertrophy. 2. Mild L5-S1 degenerative disc disease and facet hypertrophy. 3. 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    PROCEDURES:  Critical Care performed: No  Procedures   MEDICATIONS ORDERED IN ED: Medications  ibuprofen (ADVIL) tablet 800 mg (800 mg Oral Given 02/18/22 2207)     IMPRESSION / MDM / ASSESSMENT AND PLAN / ED COURSE  I reviewed the triage vital signs and the nursing notes.                              Differential diagnosis includes, but is not limited to, lumbar strain, sciatica, myalgia  Patient's presentation is most consistent with acute complicated illness / injury requiring diagnostic workup.  Patient's diagnosis is consistent with sciatica with  underlying lumbar sacral DDD.  Patient's exam is consistent with acute myalgias secondary to her radicular symptoms.  X-ray does confirm some significant degenerative changes along the lumbar sacral spine.  Patient will be discharged home with prescriptions for benzopyrene, prednisone, and diclofenac. Patient is to follow up with a local primary provider when selected, as needed or otherwise directed. Patient is given ED precautions to return to the ED for any worsening or new symptoms.     FINAL CLINICAL IMPRESSION(S) / ED DIAGNOSES   Final diagnoses:  Sciatica of left side  DDD (degenerative disc disease), lumbosacral     Rx / DC Orders   ED Discharge Orders          Ordered    predniSONE (DELTASONE) 20 MG tablet  Daily with breakfast        02/18/22 2228    cyclobenzaprine (FLEXERIL) 5 MG tablet  3 times daily PRN        02/18/22 2228    diclofenac (VOLTAREN) 75 MG EC  tablet  2 times daily        02/18/22 2228             Note:  This document was prepared using Dragon voice recognition software and may include unintentional dictation errors.    Lissa Hoard, PA-C 02/25/22 1214    Georga Hacking, MD 02/25/22 2314

## 2022-02-18 NOTE — Discharge Instructions (Signed)
Your exam and XR reveal symptoms of sciatica. There is some degeneration of the disc of the lumbar spine. Take the prescription meds as directed. Follow-up with a local primary provider for continued care. Return to the ED if needed.

## 2022-02-18 NOTE — ED Triage Notes (Signed)
Pt has back pain with pain radiating into left leg.  Sx began today   no known injury.  Denies urinary sx.  Pt alert.

## 2022-03-25 ENCOUNTER — Telehealth: Payer: Self-pay | Admitting: Family Medicine

## 2022-03-26 ENCOUNTER — Ambulatory Visit: Payer: Self-pay | Admitting: Physician Assistant

## 2022-03-26 ENCOUNTER — Encounter: Payer: Self-pay | Admitting: Physician Assistant

## 2022-03-26 DIAGNOSIS — Z113 Encounter for screening for infections with a predominantly sexual mode of transmission: Secondary | ICD-10-CM

## 2022-03-26 LAB — WET PREP FOR TRICH, YEAST, CLUE
Trichomonas Exam: NEGATIVE
Yeast Exam: NEGATIVE

## 2022-03-26 NOTE — Progress Notes (Signed)
Wet prep reviewed by A. Streilein PA-C. Jossie Ng, RN

## 2022-03-26 NOTE — Progress Notes (Signed)
[facility] Department  STI clinic/screening visit [address] [place] [address] [phone]  Subjective:  Ashley Stephenson is a 51 y.o. female being seen today for an STI screening visit. The patient reports they do not have symptoms.  Patient reports that they do not desire a pregnancy in the next year.   They reported they are not interested in discussing contraception today.    No LMP recorded. Patient has had a hysterectomy.   Patient has the following medical conditions:   Patient Active Problem List   Diagnosis Date Noted   Morbid obesity (HCC) 270 lbs 02/16/2022   History of hysterectomy 2008 02/16/2022   Morbid obesity with BMI of 45.0-49.9, adult (HCC) 01/02/2019   Essential hypertension 03/29/2018   Abnormal chest x-ray 06/05/2015   Atypical chest pain 06/05/2015   Edema extremities 06/05/2015   Episodic tension type headache 06/05/2015   Blood glucose elevated 06/05/2015   Muscle cramp, nocturnal 06/05/2015    Chief Complaint  Patient presents with   SEXUALLY TRANSMITTED DISEASE         Woman here for STI screening due to condom breakage during intercourse with regular partner about a week ago. Neither partner is symptomatic or has a known STI exposure. Patient has a remote TAH for severe menorrhagia. She feels well today.    Patient reports desire for STI screen today.  Last HIV test per patient/review of record was 02/16/22. Patient reports last pap was remote (s/p TAH).   Screening for MPX risk: Does the patient have an unexplained rash? No Is the patient MSM? No Does the patient endorse multiple sex partners or anonymous sex partners? No Did the patient have close or sexual contact with a person diagnosed with MPX? No Has the patient traveled outside the [place] where MPX is endemic? No Is there a high clinical suspicion for MPX-- evidenced by one of the following No  -Unlikely to be chickenpox  -Lymphadenopathy  -Rash that  present in same phase of evolution on any given body part See flowsheet for further details and programmatic requirements.   Immunization history:   There is no immunization history on file for this patient.   The following portions of the patient's history were reviewed and updated as appropriate: allergies, current medications, past medical history, past social history, past surgical history and problem list.  Objective:  There were no vitals filed for this visit.  Physical Exam Vitals and nursing note reviewed.  Constitutional:      Appearance: Normal appearance. She is obese.  HENT:     Head: Normocephalic and atraumatic.     Mouth/Throat:     Mouth: Mucous membranes are moist.     Pharynx: Oropharynx is clear. No oropharyngeal exudate or posterior oropharyngeal erythema.  Pulmonary:     Effort: Pulmonary effort is normal.  Abdominal:     General: Abdomen is protuberant.     Palpations: There is no mass.     Tenderness: There is no abdominal tenderness. There is no rebound.  Genitourinary:    General: Normal vulva.     Exam position: Lithotomy position.     Pubic Area: No rash or pubic lice.      Labia:        Right: No rash or lesion.        Left: No rash or lesion.      Vagina: Normal. No vaginal discharge, erythema, bleeding or lesions.     Cervix: No cervical motion tenderness,  discharge, friability, lesion or erythema.     Uterus: Absent.      Adnexa: Right adnexa normal and left adnexa normal.     Rectum: Normal.     Comments: pH = 4.0 Cervix absent. Lymphadenopathy:     Head:     Right side of head: No preauricular or posterior auricular adenopathy.     Left side of head: No preauricular or posterior auricular adenopathy.     Cervical: No cervical adenopathy.     Upper Body:     Right upper body: No supraclavicular, axillary or epitrochlear adenopathy.     Left upper body: No supraclavicular, axillary or epitrochlear adenopathy.     Lower Body: No right  inguinal adenopathy. No left inguinal adenopathy.  Skin:    General: Skin is warm and dry.     Findings: No rash.  Neurological:     Mental Status: She is alert and oriented to person, place, and time.      Assessment and Plan:  Ashley Stephenson is a 51 y.o. female presenting to the [facility] Department for STI screening  1. Routine screening for STI (sexually transmitted infection) Wet prep neg. Await GC/Chlam test results. Continue consistent condom use. - Chlamydia/Gonorrhea [facility] - WET PREP FOR TRICH, YEAST, CLUE   Return in about 6 months (around 09/26/2022) for STI screening.  Future Appointments  Date Time Provider Department Center  04/13/2022 11:40 AM Mort Sawyers, FNP [facility]    Landry Dyke, PA-C

## 2022-04-02 ENCOUNTER — Ambulatory Visit: Payer: BC Managed Care – PPO

## 2022-04-13 ENCOUNTER — Ambulatory Visit: Payer: BC Managed Care – PPO | Admitting: Family

## 2022-04-28 ENCOUNTER — Encounter: Payer: Self-pay | Admitting: *Deleted

## 2022-04-28 ENCOUNTER — Emergency Department
Admission: EM | Admit: 2022-04-28 | Discharge: 2022-04-28 | Disposition: A | Discharge status: Home / Self Care | Payer: BC Managed Care – PPO | Attending: Emergency Medicine | Admitting: Emergency Medicine

## 2022-04-28 ENCOUNTER — Other Ambulatory Visit: Payer: Self-pay

## 2022-04-28 DIAGNOSIS — R600 Localized edema: Secondary | ICD-10-CM | POA: Insufficient documentation

## 2022-04-28 DIAGNOSIS — R350 Frequency of micturition: Secondary | ICD-10-CM | POA: Insufficient documentation

## 2022-04-28 DIAGNOSIS — I10 Essential (primary) hypertension: Secondary | ICD-10-CM | POA: Diagnosis not present

## 2022-04-28 LAB — COMPREHENSIVE METABOLIC PANEL
ALT: 16 U/L (ref 0–44)
AST: 20 U/L (ref 15–41)
Albumin: 4.4 g/dL (ref 3.5–5.0)
Alkaline Phosphatase: 69 U/L (ref 38–126)
Anion gap: 9 (ref 5–15)
BUN: 16 mg/dL (ref 6–20)
CO2: 25 mmol/L (ref 22–32)
Calcium: 9.2 mg/dL (ref 8.9–10.3)
Chloride: 106 mmol/L (ref 98–111)
Creatinine, Ser: 1.01 mg/dL — ABNORMAL HIGH (ref 0.44–1.00)
GFR, Estimated: >60 mL/min (ref >60)
Glucose, Bld: 110 mg/dL — ABNORMAL HIGH (ref 70–99)
Potassium: 3.7 mmol/L (ref 3.5–5.1)
Sodium: 140 mmol/L (ref 135–145)
Total Bilirubin: 1 mg/dL (ref 0.3–1.2)
Total Protein: 8.1 g/dL (ref 6.5–8.1)

## 2022-04-28 LAB — CBC WITH DIFFERENTIAL/PLATELET
Abs Immature Granulocytes: 0.02 10*3/uL (ref 0.00–0.07)
Basophils Absolute: 0 10*3/uL (ref 0.0–0.1)
Basophils Relative: 1 %
Eosinophils Absolute: 0.1 10*3/uL (ref 0.0–0.5)
Eosinophils Relative: 2 %
HCT: 38.3 % (ref 36.0–46.0)
Hemoglobin: 12.6 g/dL (ref 12.0–15.0)
Immature Granulocytes: 1 %
Lymphocytes Relative: 33 %
Lymphs Abs: 1.3 10*3/uL (ref 0.7–4.0)
MCH: 27.7 pg (ref 26.0–34.0)
MCHC: 32.9 g/dL (ref 30.0–36.0)
MCV: 84.2 fL (ref 80.0–100.0)
Monocytes Absolute: 0.3 10*3/uL (ref 0.1–1.0)
Monocytes Relative: 9 %
Neutro Abs: 2.2 10*3/uL (ref 1.7–7.7)
Neutrophils Relative %: 54 %
Platelets: 201 10*3/uL (ref 150–400)
RBC: 4.55 MIL/uL (ref 3.87–5.11)
RDW: 12.6 % (ref 11.5–15.5)
WBC: 3.9 10*3/uL — ABNORMAL LOW (ref 4.0–10.5)
nRBC: 0 % (ref 0.0–0.2)

## 2022-04-28 LAB — URINALYSIS, ROUTINE W REFLEX MICROSCOPIC
Bilirubin Urine: NEGATIVE
Glucose, UA: NEGATIVE mg/dL
Hgb urine dipstick: NEGATIVE
Ketones, ur: NEGATIVE mg/dL
Nitrite: NEGATIVE
Protein, ur: 30 mg/dL — AB
Specific Gravity, Urine: 1.025 (ref 1.005–1.030)
pH: 5 (ref 5.0–8.0)

## 2022-04-28 MED ORDER — NITROFURANTOIN MONOHYD MACRO 100 MG PO CAPS: 100.0000 mg | ORAL_CAPSULE | Freq: Two times a day (BID) | ORAL | 0 refills | Status: AC

## 2022-04-28 MED ORDER — FUROSEMIDE 40 MG PO TABS
40.0000 mg | ORAL_TABLET | Freq: Once | ORAL | Status: AC
  Administered 2022-04-28: 40 mg via ORAL
  Filled 2022-04-28: qty 1

## 2022-04-28 NOTE — ED Provider Triage Note (Signed)
Emergency Medicine Provider Triage Evaluation Note  Ashley Stephenson , a 51 y.o. female  was evaluated in triage.  Pt complains of dysuria for the past 2 days. No abdominal pain or fever.  Physical Exam  There were no vitals taken for this visit. Gen:   Awake, no distress   Resp:  Normal effort  MSK:   Moves extremities without difficulty  Other:    Medical Decision Making  Medically screening exam initiated at 5:30 PM.  Appropriate orders placed.  Ashley Stephenson was informed that the remainder of the evaluation will be completed by another provider, this initial triage assessment does not replace that evaluation, and the importance of remaining in the ED until their evaluation is complete.    Victorino Dike, FNP 04/28/22 1731

## 2022-04-28 NOTE — ED Triage Notes (Signed)
Pt has urinary frequency and urgency for 2 days.  No n/v/d.  No back pain.  No abd pain.  Pt alert  speech clear.

## 2022-04-28 NOTE — ED Provider Notes (Signed)
[facility] [place] [facility] Provider Note  Patient Contact: 6:04 PM (approximate)   History   Urinary Frequency   HPI  Ashley Stephenson is a 51 y.o. female with a history of hypertension and hyperlipidemia, presents to the emergency department with increased urinary frequency for the past 2 days.  Patient is also concerned for bilateral lower extremity edema and is concerned that she might need a fluid pill.  She denies dysuria, low back pain, flank pain or fever.  No vaginal itching or changes in vaginal discharge.  She states that she does not have a history of diabetes but has been told that she is borderline in the past.      Physical Exam   Triage Vital Signs: ED Triage Vitals  Enc Vitals Group     BP 04/28/22 1732 (!) 149/77     Pulse Rate 04/28/22 1732 87     Resp 04/28/22 1732 18     Temp 04/28/22 1732 98.1 F (36.7 C)     Temp Source 04/28/22 1732 Oral     SpO2 04/28/22 1732 99 %     Weight 04/28/22 1729 270 lb (122.5 kg)     Height 04/28/22 1729 5\' 2"  (1.575 m)     Head Circumference --      Peak Flow --      Pain Score 04/28/22 1729 0     Pain Loc --      Pain Edu? --      Excl. in [place]? --     Most recent vital signs: Vitals:   04/28/22 1732  BP: (!) 149/77  Pulse: 87  Resp: 18  Temp: 98.1 F (36.7 C)  SpO2: 99%     General: Alert and in no acute distress. Eyes:  PERRL. EOMI. Head: No acute traumatic findings ENT:      Nose: No congestion/rhinnorhea.      Mouth/Throat: Mucous membranes are moist. Neck: No stridor. No cervical spine tenderness to palpation. Cardiovascular:  Good peripheral perfusion Respiratory: Normal respiratory effort without tachypnea or retractions. Lungs CTAB. Good air entry to the bases with no decreased or absent breath sounds. Gastrointestinal: Bowel sounds 4 quadrants. Soft and nontender to palpation. No guarding or rigidity. No palpable masses. No distention. No CVA tenderness. Musculoskeletal: Full range of  motion to all extremities.  Neurologic:  No gross focal neurologic deficits are appreciated.  Skin: Patient has 3+ pitting edema bilaterally. Other:   ED Results / Procedures / Treatments   Labs (all labs ordered are listed, but only abnormal results are displayed) Labs Reviewed  URINALYSIS, ROUTINE W REFLEX MICROSCOPIC - Abnormal; Notable for the following components:      Result Value   Color, Urine YELLOW (*)    APPearance HAZY (*)    Protein, ur 30 (*)    Leukocytes,Ua SMALL (*)    Bacteria, UA RARE (*)    All other components within normal limits  CBC WITH DIFFERENTIAL/PLATELET - Abnormal; Notable for the following components:   WBC 3.9 (*)    All other components within normal limits  COMPREHENSIVE METABOLIC PANEL - Abnormal; Notable for the following components:   Glucose, Bld 110 (*)    Creatinine, Ser 1.01 (*)    All other components within normal limits  URINE CULTURE  URINE CULTURE      PROCEDURES:  Critical Care performed: No  Procedures   MEDICATIONS ORDERED IN ED: Medications  furosemide (LASIX) tablet 40 mg (has no administration in time range)  IMPRESSION / MDM / ASSESSMENT AND PLAN / ED COURSE  I reviewed the triage vital signs and the nursing notes.                              Assessment and plan: Increased urinary frequency Lower extremity edema   51 year old female presents to the emergency department with 2 medical concerns, increased urinary frequency and lower extremity edema.  Vital signs were reassuring at triage.  On exam, patient was alert, active and nontoxic-appearing.  She did have 3+ pitting edema bilaterally.  CBC and CMP were reassuring.  Urinalysis shows a small amount of leuks and rare bacteria.  Urine culture in process.  Patient was given 40 mg of Lasix in the emergency department and started on Macrobid.  I phoned patient's spouse with a care update at the request of patient.  FINAL CLINICAL IMPRESSION(S) / ED  DIAGNOSES   Final diagnoses:  Urinary frequency     Rx / DC Orders   ED Discharge Orders          Ordered    nitrofurantoin, macrocrystal-monohydrate, (MACROBID) 100 MG capsule  2 times daily        04/28/22 1925             Note:  This document was prepared using Dragon voice recognition software and may include unintentional dictation errors.   Vallarie Mare Taft, PA-C 04/28/22 1931    Delman Kitten, MD 04/28/22 [phone]

## 2022-04-28 NOTE — Discharge Instructions (Addendum)
Take Macrobid twice daily for 7 days.

## 2022-04-30 LAB — URINE CULTURE: Culture: 20000 — AB

## 2022-11-29 ENCOUNTER — Other Ambulatory Visit: Payer: Self-pay

## 2022-11-29 ENCOUNTER — Emergency Department
Admission: EM | Admit: 2022-11-29 | Discharge: 2022-11-29 | Disposition: A | Discharge status: Home / Self Care | Payer: BC Managed Care – PPO | Attending: Emergency Medicine | Admitting: Emergency Medicine

## 2022-11-29 ENCOUNTER — Emergency Department: Payer: BC Managed Care – PPO

## 2022-11-29 DIAGNOSIS — I10 Essential (primary) hypertension: Secondary | ICD-10-CM | POA: Insufficient documentation

## 2022-11-29 DIAGNOSIS — Z1152 Encounter for screening for COVID-19: Secondary | ICD-10-CM | POA: Insufficient documentation

## 2022-11-29 DIAGNOSIS — R051 Acute cough: Secondary | ICD-10-CM | POA: Insufficient documentation

## 2022-11-29 LAB — SARS CORONAVIRUS 2 BY RT PCR: SARS Coronavirus 2 by RT PCR: NEGATIVE

## 2022-11-29 MED ORDER — DOXYCYCLINE MONOHYDRATE 100 MG PO TABS: 100.0000 mg | ORAL_TABLET | Freq: Two times a day (BID) | ORAL | 0 refills | Status: AC

## 2022-11-29 MED ORDER — DOXYCYCLINE MONOHYDRATE 100 MG PO TABS: 100.0000 mg | ORAL_TABLET | Freq: Two times a day (BID) | ORAL | 0 refills | Status: DC

## 2022-11-29 NOTE — ED Triage Notes (Signed)
Pt here with cough x3 weeks. Pt denies fever or chills. Pt states she is other wise fine.

## 2022-11-29 NOTE — ED Notes (Signed)
Patient denies pain and is resting comfortably.  

## 2022-11-29 NOTE — ED Provider Triage Note (Signed)
Emergency Medicine Provider Triage Evaluation Note  Ashley Stephenson, a 52 y.o. female  was evaluated in triage.  Pt complains of cough x 3 weeks. She denies FCS. She has been taking OTC cold medicine with limited relief.  Review of Systems  Positive: cough Negative: FCS  Physical Exam  BP (!) 174/74   Pulse 90   Temp 97.8 F (36.6 C) (Oral)   Resp 18   Ht 5\' 2"  (1.575 m)   Wt 122.5 kg   SpO2 97%   BMI 49.40 kg/m  Gen:   Awake, no distress  NAD Resp:  Normal effort CTA MSK:   Moves extremities without difficulty  Other:    Medical Decision Making  Medically screening exam initiated at 3:47 PM.  Appropriate orders placed.  Genifer Lazenby was informed that the remainder of the evaluation will be completed by another provider, this initial triage assessment does not replace that evaluation, and the importance of remaining in the ED until their evaluation is complete.  Patient to the ED for evaluation of cough x 3 weeks.    Lissa Hoard, PA-C 11/29/22 1549

## 2022-11-29 NOTE — Discharge Instructions (Addendum)
It looks like you may have a pneumonia.  Please take the antibiotic twice a day for the next 5 days.  If your cough is persisting you will need to follow-up with primary care.  Please go to the following website to schedule new (and existing) patient appointments:   [email or web address]   The following is a list of primary care offices in the area who are accepting new patients at this time.  Please reach out to one of them directly and let them know you would like to schedule an appointment to follow up on an Emergency Department visit, and/or to establish a new primary care provider (PCP).  There are likely other primary care clinics in the are who are accepting new patients, but this is an excellent place to start:  [facility] Lead physician: Dr Shirlee Latch [address] [place], [place] [address] [phone]  [facility] Lead Physician: Dr Alba Cory [address], [place], [place] [address] [phone]  [facility]  Lead Physician: Dr Olevia Perches [address] [place], [place] [address] [phone]  [facility] Lead Physician: Dr Sofie Hartigan [address] [place], [place] [address] [phone]  [facility] Lead Physician: Dr Bari Edward [address] [place], [place], [place] [address] [phone]

## 2022-11-29 NOTE — ED Provider Notes (Signed)
[facility] Provider Note    Event Date/Time   First MD Initiated Contact with Patient 11/29/22 [phone]     (approximate)   History   Cough   HPI  Ashley Stephenson is a 52 y.o. female past medical history hypertension hyperlipidemia who presents with 3 weeks of cough.  Cough is mostly dry intermittently productive of clear sputum.  No hemoptysis.  Denies fevers chills.  Denies chest pain or dyspnea.  Does have some minimal congestion but not significant.  She denies any lower extremity swelling.  No night sweats or weight loss.  She does not use tobacco.  Denies any history of GERD.  She is on ACE inhibitor.     Past Medical History:  Diagnosis Date   Hyperlipemia    Hypertension     Patient Active Problem List   Diagnosis Date Noted   Morbid obesity (HCC) 270 lbs 02/16/2022   History of hysterectomy 2008 02/16/2022   Morbid obesity with BMI of 45.0-49.9, adult (HCC) 01/02/2019   Essential hypertension 03/29/2018   Abnormal chest x-ray 06/05/2015   Atypical chest pain 06/05/2015   Edema extremities 06/05/2015   Episodic tension type headache 06/05/2015   Blood glucose elevated 06/05/2015   Muscle cramp, nocturnal 06/05/2015     Physical Exam  Triage Vital Signs: ED Triage Vitals  Enc Vitals Group     BP 11/29/22 1522 (!) 174/74     Pulse Rate 11/29/22 1520 90     Resp 11/29/22 1520 18     Temp 11/29/22 1520 97.8 F (36.6 C)     Temp Source 11/29/22 1520 Oral     SpO2 11/29/22 1520 97 %     Weight 11/29/22 1521 270 lb 1 oz (122.5 kg)     Height 11/29/22 1521 5\' 2"  (1.575 m)     Head Circumference --      Peak Flow --      Pain Score 11/29/22 1521 0     Pain Loc --      Pain Edu? --      Excl. in [place]? --     Most recent vital signs: Vitals:   11/29/22 1520 11/29/22 1522  BP:  (!) 174/74  Pulse: 90   Resp: 18   Temp: 97.8 F (36.6 C)   SpO2: 97%      General: Awake, no distress. CV:  Good peripheral perfusion.  No peripheral  edema Resp:  Normal effort.  Dry cough, no wheezing lung sounds are clear Abd:  No distention.  Neuro:             Awake, Alert, Oriented x 3  Other:     ED Results / Procedures / Treatments  Labs (all labs ordered are listed, but only abnormal results are displayed) Labs Reviewed  SARS CORONAVIRUS 2 BY RT PCR     EKG     RADIOLOGY I reviewed and interpreted the chest x-ray which shows possible right lower lobe pneumonia   PROCEDURES:  Critical Care performed: No  Procedures  The patient is on the cardiac monitor to evaluate for evidence of arrhythmia and/or significant heart rate changes.   MEDICATIONS ORDERED IN ED: Medications - No data to display   IMPRESSION / MDM / ASSESSMENT AND PLAN / ED COURSE  I reviewed the triage vital signs and the nursing notes.  Patient's presentation is most consistent with acute complicated illness / injury requiring diagnostic workup.  Differential diagnosis includes, but is not limited to, pneumonia, bronchitis, postnasal drip, GERD, malignancy, ACE inhibitor side effect  Patient is a 52 year old female who presents with 3 weeks of dry cough.  She is not having fevers chills dyspnea.  No red flag symptoms for malignancy she does not smoke has not had weight loss or night sweats.  Denies dyspnea or chest pain.  Patient's saturation is normal she is mildly hypertensive.  She looks well on exam.  Does have frequent dry cough lung sounds are clear no peripheral edema.  Chest x-ray obtained shows a possible right lower lobe pneumonia.  Given patient's symptoms we will treat with a course of doxycycline as she has no other significant comorbidities.  I did advise her that she will need to have primary care follow-up if cough continues as she may need to have additional imaging and may need to stop the ACE inhibitor.       FINAL CLINICAL IMPRESSION(S) / ED DIAGNOSES   Final diagnoses:  Acute cough      Rx / DC Orders   ED Discharge Orders          Ordered    doxycycline (ADOXA) 100 MG tablet  2 times daily,   Status:  Discontinued        11/29/22 1645    doxycycline (ADOXA) 100 MG tablet  2 times daily        11/29/22 1646             Note:  This document was prepared using Dragon voice recognition software and may include unintentional dictation errors.   Georga Hacking, MD 11/29/22 [phone]

## 2023-06-07 ENCOUNTER — Ambulatory Visit: Payer: Self-pay | Admitting: Family Medicine

## 2023-06-07 NOTE — Progress Notes (Deleted)
New patient visit   Patient: Ashley Stephenson   DOB: [birth year]   52 y.o. Female  MRN: [id number] Visit Date: 06/07/2023  Today's healthcare provider: Ronnald Ramp, MD   No chief complaint on file.  Subjective    Ashley Stephenson is a 52 y.o. female who presents today as a new patient to establish care.      Discussed the use of AI scribe software for clinical note transcription with the patient, who gave verbal consent to proceed.  History of Present Illness              Past Medical History:  Diagnosis Date   Hyperlipemia    Hypertension     Outpatient Medications Prior to Visit  Medication Sig   cyclobenzaprine (FLEXERIL) 5 MG tablet Take 1 tablet (5 mg total) by mouth 3 (three) times daily as needed.   No facility-administered medications prior to visit.    Past Surgical History:  Procedure Laterality Date   TOTAL ABDOMINAL HYSTERECTOMY     bleeding   Family Status  Relation Name Status   Mother  (Not Specified)  No partnership data on file   Family History  Problem Relation Age of Onset   CAD Mother    Social History   Socioeconomic History   Marital status: Single    Spouse name: Not on file   Number of children: Not on file   Years of education: Not on file   Highest education level: Not on file  Occupational History   Not on file  Tobacco Use   Smoking status: Never   Smokeless tobacco: Never  Vaping Use   Vaping status: Never Used  Substance and Sexual Activity   Alcohol use: No    Alcohol/week: 0.0 standard drinks of alcohol   Drug use: Never   Sexual activity: Not on file  Other Topics Concern   Not on file  Social History Narrative   Not on file   Social Determinants of Health   Financial Resource Strain: Not on file  Food Insecurity: Not on file  Transportation Needs: Not on file  Physical Activity: Not on file  Stress: Not on file  Social Connections: Not on file     Allergies  Allergen Reactions    Penicillins Swelling    Other reaction(s): Swelling     There is no immunization history on file for this patient.  Health Maintenance  Topic Date Due   Hepatitis C Screening  Never done   DTaP/Tdap/Td (1 - Tdap) Never done   Colonoscopy  Never done   MAMMOGRAM  Never done   Zoster Vaccines- Shingrix (1 of 2) Never done   INFLUENZA VACCINE  Never done   COVID-19 Vaccine (1 - 2023-24 season) Never done   HIV Screening  Completed   HPV VACCINES  Aged Out    Patient Care Team: Leotis Shames, MD as PCP - General (Internal Medicine)  Review of Systems  {Insert previous labs (optional):23779} {See past labs  Heme  Chem  Endocrine  Serology  Results Review (optional):1}   Objective    There were no vitals taken for this visit. {Insert last BP/Wt (optional):23777}{See vitals history (optional):1}    Depression Screen     No data to display         No results found for any visits on 06/07/23.   Physical Exam ***    Assessment & Plan      Problem List Items Addressed This Visit  None   Assessment and Plan               No follow-ups on file.      Ronnald Ramp, MD  [facility] [phone] (phone) [phone] (fax)  [facility]

## 2023-12-22 ENCOUNTER — Emergency Department

## 2023-12-22 ENCOUNTER — Emergency Department
Admission: EM | Admit: 2023-12-22 | Discharge: 2023-12-22 | Disposition: A | Discharge status: Home / Self Care | Attending: Emergency Medicine | Admitting: Emergency Medicine

## 2023-12-22 ENCOUNTER — Other Ambulatory Visit: Payer: Self-pay

## 2023-12-22 DIAGNOSIS — I10 Essential (primary) hypertension: Secondary | ICD-10-CM | POA: Diagnosis not present

## 2023-12-22 DIAGNOSIS — M79671 Pain in right foot: Secondary | ICD-10-CM | POA: Diagnosis present

## 2023-12-22 MED ORDER — MELOXICAM 15 MG PO TABS: 15.0000 mg | ORAL_TABLET | Freq: Every day | ORAL | 0 refills | Status: DC

## 2023-12-22 MED ORDER — IBUPROFEN 800 MG PO TABS
800.0000 mg | ORAL_TABLET | Freq: Once | ORAL | Status: AC
  Administered 2023-12-22: 800 mg via ORAL
  Filled 2023-12-22: qty 1

## 2023-12-22 MED ORDER — CYCLOBENZAPRINE HCL 10 MG PO TABS: 10.0000 mg | ORAL_TABLET | Freq: Three times a day (TID) | ORAL | 0 refills | Status: AC | PRN

## 2023-12-22 MED ORDER — CYCLOBENZAPRINE HCL 10 MG PO TABS: 10.0000 mg | ORAL_TABLET | Freq: Three times a day (TID) | ORAL | 0 refills | Status: DC | PRN

## 2023-12-22 MED ORDER — MELOXICAM 15 MG PO TABS: 15.0000 mg | ORAL_TABLET | Freq: Every day | ORAL | 0 refills | Status: AC

## 2023-12-22 NOTE — ED Provider Notes (Signed)
 [facility] Provider Note    Event Date/Time   First MD Initiated Contact with Patient 12/22/23 2050     (approximate)   History   Foot Pain    HPI  Ashley Stephenson is a 53 y.o. female    with a past medical history of UTI, sciatica of left side, BPPV, essential hypertension, OSA, left knee pain,  who presents to the ED complaining of right foot pain. 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  Pain is getting worse, patient is unable to walk or bear weight.  Patient denies history of gout.       Physical Exam   Triage Vital Signs: ED Triage Vitals  Encounter Vitals Group     BP 12/22/23 1936 (!) 143/69     Systolic BP Percentile --      Diastolic BP Percentile --      Pulse Rate 12/22/23 1936 93     Resp 12/22/23 1936 18     Temp 12/22/23 1936 98.4 F (36.9 C)     Temp Source 12/22/23 1936 Oral     SpO2 12/22/23 1936 100 %     Weight 12/22/23 1935 270 lb (122.5 kg)     Height 12/22/23 1935 5\' 2"  (1.575 m)     Head Circumference --      Peak Flow --      Pain Score 12/22/23 1935 10     Pain Loc --      Pain Education --      Exclude from Growth Chart --     Most recent vital signs: Vitals:   12/22/23 1936  BP: (!) 143/69  Pulse: 93  Resp: 18  Temp: 98.4 F (36.9 C)  SpO2: 100%     Constitutional: Alert, NAD. Able to speak in complete sentences without cough or dyspnea  Eyes: Conjunctivae are normal.  Head: Atraumatic. Nose: No congestion/rhinnorhea. Mouth/Throat: Mucous membranes are moist.   Neck: Painless ROM. Supple. No JVD, nodes, thyromegaly  Cardiovascular:   Good peripheral circulation.RRR no murmurs, gallops, rubs  Respiratory: Normal respiratory effort.  No retractions. Clear to auscultation bilaterally without wheezing or crackles  Gastrointestinal: Soft and nontender.  Musculoskeletal:  no deformity Right ankle:  Skin is intact no ecchymosis no hematomas, edema grade 2.  Tenderness to palpation at the level of the great toe that extends to the ankle.  Pulses is positive, strength decreased 4/5, sensation intact. Neurologic:  MAE spontaneously. No gross focal neurologic deficits are appreciated.  Skin:  Skin is warm, dry and intact. No rash noted. Psychiatric: Mood and affect are normal. Speech and behavior are normal.    ED Results / Procedures / Treatments   Labs (all labs ordered are listed, but only abnormal results are displayed) Labs Reviewed - No data to display   EKG     RADIOLOGY I independently reviewed and interpreted imaging and agree with radiologists findings.      PROCEDURES:  Critical Care performed:   Procedures   MEDICATIONS ORDERED IN ED: Medications  ibuprofen  (ADVIL ) tablet 800 mg (800 mg Oral Given 12/22/23 2201)   Clinical Course as of 12/22/23 2246  Wed Dec 22, 2023  2232 DG Ankle Complete Right Soft tissue swelling without acute bony abnormality. [AE]  2238 Reassessed the patient, pain decreased with ibuprofen .  Updated patient with results of x-ray.  Patient  is going to be discharged with Flexeril  meloxicam  and follow-up with orthopedics.  Patient agreed with the plan [AE]    Clinical Course User Index [AE] Awilda Lennox, PA-C    IMPRESSION / MDM / ASSESSMENT AND PLAN / ED COURSE  I reviewed the triage vital signs and the nursing notes.  Differential diagnosis includes, but is not limited to, muscle strain, gout, fracture, tendinitis  Patient's presentation is most consistent with acute complicated illness / injury requiring diagnostic workup.   Patient's diagnosis is consistent with right foot soft tissue injury, calcaneal spurring. I independently reviewed and interpreted imaging and agree with radiologists findings fracture or dislocation I did review the patient's allergies and medications.The patient is in stable and satisfactory condition  for discharge home.  Admission patient received ibuprofen .  Patient is going to be discharged with boot and crutches.  Patient will be discharged home with prescriptions for Flexeril , meloxicam . Patient is to follow up with orthopedics as needed or otherwise directed. Patient is given ED precautions to return to the ED for any worsening or new symptoms. Discussed plan of care with patient, answered all of patient's questions, Patient agreeable to plan of care. Advised patient to take medications according to the instructions on the label. Discussed possible side effects of new medications. Patient verbalized understanding.    FINAL CLINICAL IMPRESSION(S) / ED DIAGNOSES   Final diagnoses:  Foot pain, right     Rx / DC Orders   ED Discharge Orders          Ordered    cyclobenzaprine  (FLEXERIL ) 10 MG tablet  3 times daily PRN,   Status:  Discontinued        12/22/23 2243    meloxicam  (MOBIC ) 15 MG tablet  Daily,   Status:  Discontinued        12/22/23 2243    cyclobenzaprine  (FLEXERIL ) 10 MG tablet  3 times daily PRN        12/22/23 2246    meloxicam  (MOBIC ) 15 MG tablet  Daily        12/22/23 2246             Note:  This document was prepared using Dragon voice recognition software and may include unintentional dictation errors.   Awilda Lennox, PA-C 12/22/23 2247    Kandee Orion, MD 12/22/23 [phone]

## 2023-12-22 NOTE — ED Triage Notes (Signed)
 Pt reports right foot pain that began earlier today. Pt denies injury or fall.

## 2023-12-22 NOTE — Discharge Instructions (Addendum)
 Have been diagnosed with right foot pain, secondary to calcaneus spurring.  Please take Flexeril  1 tablet by mouth every 8 hours after main meals.  Please take meloxicam  1 tablet by mouth with breakfast.  Please come back to ED or go to your PCP if you have new symptoms or symptoms worsen.  Please call orthopedics and make an appointment for a follow-up.
